# Patient Record
Sex: Female | Born: 1975 | Race: White | Hispanic: No | Marital: Married | Smoking: Current every day smoker
Health system: Southern US, Community
[De-identification: ages and names within clinical notes are randomized; demographics above are authoritative.]

## PROBLEM LIST (undated history)

## (undated) DIAGNOSIS — R519 Headache, unspecified: Secondary | ICD-10-CM

## (undated) DIAGNOSIS — J45909 Unspecified asthma, uncomplicated: Secondary | ICD-10-CM

## (undated) DIAGNOSIS — F419 Anxiety disorder, unspecified: Secondary | ICD-10-CM

## (undated) DIAGNOSIS — F329 Major depressive disorder, single episode, unspecified: Secondary | ICD-10-CM

## (undated) DIAGNOSIS — M1990 Unspecified osteoarthritis, unspecified site: Secondary | ICD-10-CM

## (undated) DIAGNOSIS — M797 Fibromyalgia: Secondary | ICD-10-CM

## (undated) DIAGNOSIS — F32A Depression, unspecified: Secondary | ICD-10-CM

## (undated) DIAGNOSIS — F410 Panic disorder [episodic paroxysmal anxiety] without agoraphobia: Secondary | ICD-10-CM

## (undated) DIAGNOSIS — I10 Essential (primary) hypertension: Secondary | ICD-10-CM

## (undated) DIAGNOSIS — M419 Scoliosis, unspecified: Secondary | ICD-10-CM

## (undated) DIAGNOSIS — M719 Bursopathy, unspecified: Secondary | ICD-10-CM

## (undated) DIAGNOSIS — R51 Headache: Secondary | ICD-10-CM

## (undated) HISTORY — DX: Headache, unspecified: R51.9

## (undated) HISTORY — DX: Bursopathy, unspecified: M71.9

## (undated) HISTORY — DX: Fibromyalgia: M79.7

## (undated) HISTORY — PX: MULTIPLE TOOTH EXTRACTIONS: SHX2053

## (undated) HISTORY — DX: Headache: R51

## (undated) HISTORY — DX: Scoliosis, unspecified: M41.9

## (undated) HISTORY — DX: Anxiety disorder, unspecified: F41.9

## (undated) HISTORY — DX: Unspecified osteoarthritis, unspecified site: M19.90

## (undated) HISTORY — PX: OTHER SURGICAL HISTORY: SHX169

## (undated) HISTORY — DX: Depression, unspecified: F32.A

## (undated) HISTORY — DX: Major depressive disorder, single episode, unspecified: F32.9

---

## 2002-07-07 ENCOUNTER — Inpatient Hospital Stay (HOSPITAL_COMMUNITY): Admission: AD | Admit: 2002-07-07 | Discharge: 2002-07-07 | Payer: Self-pay | Admitting: Obstetrics and Gynecology

## 2002-07-16 ENCOUNTER — Other Ambulatory Visit: Admission: RE | Admit: 2002-07-16 | Discharge: 2002-07-16 | Payer: Self-pay | Admitting: *Deleted

## 2003-02-21 ENCOUNTER — Inpatient Hospital Stay (HOSPITAL_COMMUNITY): Admission: AD | Admit: 2003-02-21 | Discharge: 2003-02-24 | Payer: Self-pay | Admitting: *Deleted

## 2003-03-31 ENCOUNTER — Other Ambulatory Visit: Admission: RE | Admit: 2003-03-31 | Discharge: 2003-03-31 | Payer: Self-pay | Admitting: *Deleted

## 2007-02-20 ENCOUNTER — Emergency Department (HOSPITAL_COMMUNITY): Admission: EM | Admit: 2007-02-20 | Discharge: 2007-02-20 | Payer: Self-pay | Admitting: Emergency Medicine

## 2008-02-12 ENCOUNTER — Inpatient Hospital Stay (HOSPITAL_COMMUNITY): Admission: RE | Admit: 2008-02-12 | Discharge: 2008-02-15 | Payer: Self-pay | Admitting: Obstetrics and Gynecology

## 2009-09-14 ENCOUNTER — Inpatient Hospital Stay (HOSPITAL_COMMUNITY): Admission: RE | Admit: 2009-09-14 | Discharge: 2009-09-16 | Payer: Self-pay | Admitting: Obstetrics and Gynecology

## 2010-07-04 LAB — TYPE AND SCREEN: Antibody Screen: NEGATIVE

## 2010-07-04 LAB — CBC
HCT: 29.9 % — ABNORMAL LOW (ref 36.0–46.0)
HCT: 34.8 % — ABNORMAL LOW (ref 36.0–46.0)
Hemoglobin: 9.3 g/dL — ABNORMAL LOW (ref 12.0–15.0)
Hemoglobin: 10.5 g/dL — ABNORMAL LOW (ref 12.0–15.0)
Hemoglobin: 12.1 g/dL (ref 12.0–15.0)
MCHC: 35.1 g/dL (ref 30.0–36.0)
MCV: 85.3 fL (ref 78.0–100.0)
Platelets: 183 10*3/uL (ref 150–400)
RBC: 3.08 MIL/uL — ABNORMAL LOW (ref 3.87–5.11)
RBC: 3.53 MIL/uL — ABNORMAL LOW (ref 3.87–5.11)
RBC: 4.08 MIL/uL (ref 3.87–5.11)
RDW: 12.6 % (ref 11.5–15.5)
WBC: 12.9 10*3/uL — ABNORMAL HIGH (ref 4.0–10.5)
WBC: 17.8 10*3/uL — ABNORMAL HIGH (ref 4.0–10.5)
WBC: 8.3 10*3/uL (ref 4.0–10.5)

## 2010-07-04 LAB — CCBB MATERNAL DONOR DRAW

## 2010-08-30 NOTE — Op Note (Signed)
NAMEALEXANDERIA, Melissa Walter                ACCOUNT NO.:  [id number]   MEDICAL RECORD NO.:  [id number]          PATIENT TYPE:  INP   LOCATION:  9121                          FACILITY:  [facility]   PHYSICIAN:  Duke Salvia. Marcelle Overlie, M.D.DATE OF BIRTH:  07/28/1975   DATE OF PROCEDURE:  02/12/2008  DATE OF DISCHARGE:                               OPERATIVE REPORT   PREOPERATIVE DIAGNOSIS:  Repeat cesarean section at term.   POSTOPERATIVE DIAGNOSIS:  Repeat cesarean section at term.   PROCEDURE:  Repeat low-transverse cesarean section.   SURGEON:  Duke Salvia. Marcelle Overlie, MD   ANESTHESIA:  Spinal.   COMPLICATIONS:  None.   DRAINS:  Foley catheter.   BLOOD LOSS:  700.   PROCEDURE AND FINDINGS:  The patient was taken to the operating room.  After an adequate level of spinal anesthetic was obtained with the  patient supine, the abdomen prepped and draped in usual manner for  sterile abdominal procedures.  Foley catheter was positioned draining  clear urine.  Transverse incision was made through the old scar which  was well healed, carried down to the fascia which was incised, and  extended transversely.  Rectus muscle was divided in the midline.  Peritoneum entered superiorly without incident and extended vertically.  There were some omental adhesions adherent to the anterior abdominal  wall which were lysed in an avascular plane.  Bladder blade was  positioned.  The vesicouterine serosa was incised and the bladder  bluntly and sharply dissected below, bladder blade repositioned.  Transverse incision made the lower segment extended with blunt  dissection.  Clear fluid noted.  The patient delivered of a healthy  female, Apgars 8 and 9 with the assistance of the BE and fundal pressure.  Loose nuchal cord x1.  Infant suctioned, cord clamped, and passed to  pediatric team for further care.  Placenta delivered manually intact,  uterus exteriorized, cavity wiped clean with laparotomy pack.  Closure  obtained  with first layer of 0 chromic followed by an imbricating layer  of 0 chromic.  The tubes and ovaries were normal.  The bladder flap area  was intact and hemostatic.  Clear urine noted at that point.  Prior to  closure, sponge, needle, and instruments counts were reported as correct  x2.  Peritoneum was closed with running 2-0 Vicryl suture.  2-0 Vicryl  interrupted sutures were used to reapproximate the rectus muscles, 0 PDS  used from laterally to midline on  either side to close the fascia.  Subcutaneous tissue was hemostatic.  Clips and Steri-Strips were used on the skin.  Again, clear urine noted  at the end of the case.  She did receive preop antibiotics and also  Pitocin IV after the cord was clamped.  Mother and baby were doing well  at that point.      Richard M. Marcelle Overlie, M.D.  Electronically Signed     RMH/MEDQ  D:  02/12/2008  T:  02/13/2008  Job:  [id number]

## 2010-08-30 NOTE — Discharge Summary (Signed)
Melissa, Walter                ACCOUNT NO.:  [id number]   MEDICAL RECORD NO.:  [id number]          PATIENT TYPE:  INP   LOCATION:  9121                          [facility]ILITY:  [facility]   PHYSICIAN:  Guy Sandifer. Henderson Cloud, M.D. DATE OF BIRTH:  27-Jan-1976   DATE OF ADMISSION:  02/12/2008  DATE OF DISCHARGE:  02/15/2008                               DISCHARGE SUMMARY   ADMITTING DIAGNOSIS:  Previous cesarean section, desires repeat.   DISCHARGE DIAGNOSIS:  Previous cesarean section, desires repeat.   PROCEDURE:  On February 12, 2008 is repeat low-transverse cesarean  section.   REASON FOR ADMISSION:  The patient is a 35 year old married white female  G2, P1 with an EDC of February 14, 2008 who had previous cesarean  section.  After discussion, she desires repeat.  She is admitted for  this.   HOSPITAL COURSE:  The patient has been in the hospital and undergoes the  above procedure.  It is productive of a viable female infant, Apgars of 8  and 9 at 1 and 5 minutes respectively.  After surgery, she has a good  resumption of bowel activity and bladder activity.  She is tolerating  regular diet, has vital signs that are stable, and she is afebrile.  Hemoglobin on the first postoperative day is 8.5.   CONDITION ON DISCHARGE:  Good.   DIET:  Regular as tolerated.   ACTIVITY:  No lifting, operation of automobiles, no vaginal entry.  She  is to call the office for problems including but not limited to  temperature of 101 degrees, persistent nausea or vomiting, heavy vaginal  bleeding.  Followup is in the office in 1-2 weeks.   MEDICATIONS:  Percocet 5/325 mg #40 one to two p.o. q.6 h. p.r.n.;  ibuprofen 600 mg q.6 h. p.r.n.; Integra iron supplement #30 one p.o.  daily, one refill.      Guy Sandifer Henderson Cloud, M.D.  Electronically Signed     JET/MEDQ  D:  02/15/2008  T:  02/15/2008  Job:  [id number]

## 2010-08-30 NOTE — H&P (Signed)
NAMECHARLEE, Melissa Walter                ACCOUNT NO.:  [id number]   MEDICAL RECORD NO.:  [id number]          PATIENT TYPE:   LOCATION:                                [facility]   PHYSICIAN:  Duke Salvia. Marcelle Overlie, M.D.    DATE OF BIRTH:   DATE OF ADMISSION:  02/12/2008  DATE OF DISCHARGE:                              HISTORY & PHYSICAL   Date of scheduled surgery on February 12, 2008.   CHIEF COMPLAINT:  Repeat cesarean section at term.   HISTORY OF PRESENT ILLNESS:  A 35 year old G2, P63, EDD on October 2009,  as presents for repeat cesarean section, declines VBAC.  First section  done for CPD.  She has had an uneventful pregnancy, taking Protonix for  GERD.  The first trimester screening tests were normal.  She is Rh  negative, did receive RhoGAM, GBS was negative, 1-hour GTT was 101.  This procedure including risks related to bleeding, infection, adjacent  organ injury, transfusion, all reviewed with her which she understands  and accepts.   PAST MEDICAL HISTORY:   ALLERGIES:  LATEX allergy possibly MORPHINE.   OPERATIONS:  Cesarean section.   REMAINDER OF HER PAST MEDICAL HISTORY:  Please see the [facility] form.   PHYSICAL EXAMINATION:  VITAL SIGNS:  Temperature 98.2, blood pressure  120/78.  HEENT:  Unremarkable.  NECK:  Supple without masses.  LUNGS:  Clear.  CARDIOVASCULAR:  Regular rate and rhythm without murmurs, rubs, or  gallops.  BREASTS:  Not examined.  Term, fundal height.  Fetal heart rate 140.  Cervix is closed.  EXTREMITIES:  Unremarkable.  NEUROLOGIC:  Unremarkable.   IMPRESSION:  Term intrauterine pregnancy, previous cesarean section for  repeat cesarean section.  Procedure and risks reviewed as above up.      Richard M. Marcelle Overlie, M.D.  Electronically Signed     RMH/MEDQ  D:  02/05/2008  T:  02/06/2008  Job:  [id number]

## 2010-09-02 NOTE — Op Note (Signed)
NAME:  Melissa Walter, Melissa Walter                          ACCOUNT NO.:  [id number]   MEDICAL RECORD NO.:  [id number]                   PATIENT TYPE:  INP   LOCATION:  9142                                 [facility]ILITY[facility]   PHYSICIAN:  Gerri Spore B. Earlene Plater, M.D.               DATE OF BIRTH:  1975/12/03   DATE OF PROCEDURE:  DATE OF DISCHARGE:                                 OPERATIVE REPORT   PREOPERATIVE DIAGNOSES:  1. Term intrauterine pregnancy with spontaneous labor.  2. Failure to progress beyond 8 cm.   POSTOPERATIVE DIAGNOSES:  1. Term intrauterine pregnancy with spontaneous labor.  2. Failure to progress beyond 8 cm.   PROCEDURE:  Primary low transverse cesarean section.   SURGEON:  Chester Holstein. Earlene Plater, M.D.   ANESTHESIA:  Epidural.   FINDINGS:  Viable female infant, thin meconium, weight 8 pounds 8 ounces, O-P  position, Apgars 8 and 9.  Normal uterus, tubes, and ovaries.   BLOOD LOSS:  500 mL.   COMPLICATIONS:  None.   INDICATIONS:  The patient presented in spontaneous labor and at  approximately 8 cm around 6 a.m. on the day of admission.  Patient did not  progress beyond 8 cm despite Pitocin augmentation with documentation of  adequate labor by IUPC.  Patient remained unchanged for approximately 8  hours in total.  Given the lack of progress, I recommended we proceed with  cesarean section.   PROCEDURE:  The patient was taken to the operating room with an epidural  anesthesia in place.  She was prepped and draped in the standard fashion.  A  Foley catheter was already in the bladder.   A Pfannenstiel incision made with a knife and carried sharply to the fascia.  The fascia was divided sharply.  The superior edge of the fascial incision  was elevated with Kocher clamps and the inferior and the underlying rectus  muscles were dissected off sharply.  Repeated in the midline in a similar  fashion.   The midline was identified and posterior sheath and peritoneum entered   sharply.   Bladder blade inserted, bladder flap created with sharp and blunt technique.  The uterine incision was made in a low transverse fashion with the knife.  Extended laterally with bandage scissors.  Lightly-tinted meconium fluid  noted at entry to the uterine cavity.   The infant's head was delivered through the incision without difficulty.  Assistance with the Kiwi vacuum was necessary to deliver the head.  This was  placed on the flexion point in the midline just anterior to the posterior  fontanelle.  __________ traction necessary to deliver the head, no pop-offs  encountered from the mid-brain zone.   The anterior shoulder was delivered first to facilitate the remainder of  delivery.  The cord was clamped and cut, and the infant handed off to the  waiting pediatricians.  The DeLee suction was used just after  delivery of  the head, given the light meconium.   The placenta was removed manually, and the uterus cleared of all clots and  debris.  The uterine incision was inspected and was free of extension.   The uterine incision was closed in a running lock stitch of 0 Monocryl.  A  second imbricating layer placed with the same suture.  Bleeder noted at the  right side of the incision about 2 cm medial to the lateral-most aspect.  This was made hemostatic with a figure-of-eight suture of 0 chromic.   The pelvis was irrigated, and the uterine incision and bladder flap were  hemostatic.  The subfascial space was hemostatic.  The fascia was closed  with a running stitch of 0 Vicryl.  The subcutaneous tissue was irrigated  and made hemostatic with a Bovie.  The skin was closed with staples.   The patient tolerated the procedure well, and there were no complications.  She was taken to the recovery room in awake, alert, and stable condition.  All counts were correct, per the operating room staff.                                               Gerri Spore B. Earlene Plater, M.D.    WBD/MEDQ   D:  02/21/2003  T:  02/22/2003  Job:  [id number]

## 2010-09-02 NOTE — Discharge Summary (Signed)
NAME:  Melissa Walter, Melissa Walter                          ACCOUNT NO.:  [id number]   MEDICAL RECORD NO.:  [id number]                   PATIENT TYPE:  INP   LOCATION:  9142                                 [facility]   PHYSICIAN:  Gerri Spore B. Earlene Plater, M.D.               DATE OF BIRTH:  1975-07-27   DATE OF ADMISSION:  02/21/2003  DATE OF DISCHARGE:  02/24/2003                                 DISCHARGE SUMMARY   ADMISSION DIAGNOSES:  1. Term intrauterine pregnancy.  2. Active labor.  3. Failure to progress beyond 8 cm.   DISCHARGE DIAGNOSES:  1. Term intrauterine pregnancy.  2. Active labor.  3. Failure to progress beyond 8 cm.   PROCEDURES:  1. Admission for management of labor.  2. Primary low transverse cesarean section for failure to progress beyond 8     cm.   HISTORY OF PRESENT ILLNESS:  This is a 35 year old, white female, G1, P0,  39+ weeks who presented in spontaneous labor.  She was 8 cm on admission.   HOSPITAL COURSE:  The patient was admitted to labor and delivery and would  not progress beyond 8 cm despite Pitocin augmentation and documentation of  adequate labor by IUCP.  Given lack of progress, we proceeded with cesarean  section.  Findings at the time of surgery included a viable female infant with  thin meconium, weight 8 pounds 8 ounces in the OP position, Apgar's 8 and 9  with normal uterus, tubes and ovaries.  Postoperatively, the patient rapidly  regained ability to ambulate, void and tolerate a regular diet.  She was  discharged to home on postop day #3 in satisfactory condition.   DISCHARGE MEDICATIONS:  Tylox one to two p.o. q.4-6h. p.r.n. pain.   SPECIAL INSTRUCTIONS:  Standard preprinted instructions were given prior to  dismissal.   FOLLOW UP:  Follow up at [facility] in a month.   DISPOSITION:  Satisfactory.                                               Gerri Spore B. Earlene Plater, M.D.    WBD/MEDQ  D:  03/17/2003  T:  03/17/2003  Job:  [id number]

## 2010-09-02 NOTE — H&P (Signed)
   NAME:  Melissa Walter, Melissa Walter                          ACCOUNT NO.:  [id number]   MEDICAL RECORD NO.:  [id number]                   PATIENT TYPE:  INP   LOCATION:  9168                                 [facility]   PHYSICIAN:  Gerri Spore B. Earlene Plater, M.D.               DATE OF BIRTH:  [birth year]   DATE OF ADMISSION:  02/21/2003  DATE OF DISCHARGE:                                HISTORY & PHYSICAL   ADMISSION DIAGNOSES:  1. A 39 week intrauterine pregnancy.  2. Spontaneous labor.   HISTORY OF PRESENT ILLNESS:  A 35 year old white female gravida 1, para 0 at  39+ weeks present in spontaneous labor. Was found to be 8 cm in Maternity  Admissions Unit.   PRENATAL CARE:  [facility], Dr. Earlene Plater,  uncomplicated.   LABORATORY DATA:  Rh negative and received RhoGAM. Group B strep negative.  O negative. Rubella immune. Glucola  normal.   PAST MEDICAL HISTORY:  None.   FAMILY HISTORY:  Noncontributory.   PAST SURGICAL HISTORY:  See prenatal records.   REVIEW OF SYSTEMS:  Otherwise noncontributory.   PHYSICAL EXAMINATION:  VITAL SIGNS:  The patient noted to be afebrile with  stable vital signs. Fetal heart rate tracing is reactive with contractions  every 5 minutes.  GENERAL:  Alert and oriented. No acute distress.  SKIN:  Warm and dry with no lesions.  HEART:  Regular rate and rhythm.  LUNGS:  Clear to auscultation.  ABDOMEN:  Gravid. Fundal height 41 cm. Estimated fetal weight in the office  yesterday was 8 pounds 11 ounces by ultrasound.  PELVIC:  Cervix is 8 cm dilated, completely effaced, zero station, vertex.  Membranes are ruptured and there is light meconium staining which is clear  (no particulate matter). IUPC inserted.   ASSESSMENT:  Term intrauterine pregnancy, spontaneous labor. No progress in  the last 2 hours. Will monitor [place] unit and if inadequate, augment  with Pitocin.                                               Gerri Spore B. Earlene Plater,  M.D.    WBD/MEDQ  D:  02/21/2003  T:  02/21/2003  Job:  [id number]

## 2011-01-16 LAB — CBC
HCT: 25.1 — ABNORMAL LOW
HCT: 38.8
Hemoglobin: 13.1
MCHC: 33.7
MCV: 85.4
MCV: 85.5
Platelets: 161
RBC: 2.94 — ABNORMAL LOW
RBC: 4.54
RDW: 13.4
WBC: 9.7

## 2011-01-16 LAB — CCBB MATERNAL DONOR DRAW

## 2011-01-16 LAB — TYPE AND SCREEN: ABO/RH(D): O NEG

## 2011-01-16 LAB — ABO/RH: ABO/RH(D): O NEG

## 2014-01-29 DIAGNOSIS — Z720 Tobacco use: Secondary | ICD-10-CM | POA: Insufficient documentation

## 2015-01-23 ENCOUNTER — Emergency Department (INDEPENDENT_AMBULATORY_CARE_PROVIDER_SITE_OTHER)
Admission: EM | Admit: 2015-01-23 | Discharge: 2015-01-23 | Disposition: A | Discharge status: Home / Self Care | Payer: 59 | Source: Home / Self Care | Attending: Family Medicine | Admitting: Family Medicine

## 2015-01-23 ENCOUNTER — Encounter (HOSPITAL_COMMUNITY): Payer: Self-pay | Admitting: Emergency Medicine

## 2015-01-23 DIAGNOSIS — F410 Panic disorder [episodic paroxysmal anxiety] without agoraphobia: Secondary | ICD-10-CM

## 2015-01-23 DIAGNOSIS — F430 Acute stress reaction: Principal | ICD-10-CM

## 2015-01-23 HISTORY — DX: Panic disorder (episodic paroxysmal anxiety): F41.0

## 2015-01-23 LAB — POCT I-STAT, CHEM 8
BUN: 9 mg/dL (ref 6–20)
CALCIUM ION: 1.17 mmol/L (ref 1.12–1.23)
CHLORIDE: 105 mmol/L (ref 101–111)
CREATININE: 0.6 mg/dL (ref 0.44–1.00)
GLUCOSE: 95 mg/dL (ref 65–99)
HCT: 42 % (ref 36.0–46.0)
Hemoglobin: 14.3 g/dL (ref 12.0–15.0)
Potassium: 3.9 mmol/L (ref 3.5–5.1)
Sodium: 140 mmol/L (ref 135–145)
TCO2: 24 mmol/L (ref 0–100)

## 2015-01-23 MED ORDER — CLONAZEPAM 0.5 MG PO TABS: 0.5000 mg | ORAL_TABLET | Freq: Two times a day (BID) | ORAL | Status: DC | PRN

## 2015-01-23 NOTE — ED Notes (Signed)
Patient reports around 5:00 pm this afternoon started noticing multiple complaints.  Patient was at work.  Denies getting upset prior to feeling so bad.  History of anxiety.  Patient now has headache, vision changes, chest pain, quivering all over, breathing fast, feeling as if she cannot breathe.

## 2015-01-23 NOTE — ED Provider Notes (Signed)
CSN: [id number]     Arrival date & time 01/23/15  1845 History   First MD Initiated Contact with Patient 01/23/15 1900     Chief Complaint  Patient presents with  . Headache  . Shortness of Breath   (Consider location/radiation/quality/duration/timing/severity/associated sxs/prior Treatment) Patient is a 39 y.o. female presenting with anxiety. The history is provided by the patient.  Anxiety This is a new problem. The current episode started 1 to 2 hours ago. The problem has not changed since onset.Associated symptoms include chest pain, headaches and shortness of breath. Associated symptoms comments: Onset at 5pm at work at [employer].. The symptoms are aggravated by stress.    Past Medical History  Diagnosis Date  . Panic attacks    History reviewed. No pertinent past surgical history. No family history on file. Social History  Substance Use Topics  . Smoking status: None  . Smokeless tobacco: None  . Alcohol Use: None   OB History    No data available     Review of Systems  Constitutional: Negative.   Eyes: Positive for visual disturbance.  Respiratory: Positive for shortness of breath. Negative for wheezing.   Cardiovascular: Positive for chest pain. Negative for palpitations and leg swelling.  Gastrointestinal: Negative.   Neurological: Positive for dizziness and headaches.  All other systems reviewed and are negative.   Allergies  Review of patient's allergies indicates no known allergies.  Home Medications   Prior to Admission medications   Medication Sig Start Date End Date Taking? Authorizing Provider  Zolpidem Tartrate (AMBIEN PO) Take by mouth.   Yes Historical Provider, MD  clonazePAM (KLONOPIN) 0.5 MG tablet Take 1 tablet (0.5 mg total) by mouth 2 (two) times daily as needed for anxiety. 01/23/15   Linna Hoff, MD   Meds Ordered and Administered this Visit  Medications - No data to display  BP 135/85 mmHg  Pulse 126  Temp(Src) 99.3 F (37.4 C) (Oral)   Resp 28  SpO2 100% No data found.   Physical Exam  Constitutional: She is oriented to person, place, and time. She appears well-developed and well-nourished.  Eyes: Conjunctivae and EOM are normal. Pupils are equal, round, and reactive to light.  Neck: Normal range of motion. Neck supple.  Cardiovascular: Regular rhythm, normal heart sounds, intact distal pulses and normal pulses.  Tachycardia present.  PMI is not displaced.   No murmur heard. Pulmonary/Chest: Effort normal and breath sounds normal.  Lymphadenopathy:    She has no cervical adenopathy.  Neurological: She is alert and oriented to person, place, and time.  Skin: Skin is warm and dry.  Nursing note and vitals reviewed.   ED Course  Procedures (including critical care time)  Labs Review Labs Reviewed  POCT I-STAT, CHEM 8    i-stat 8 wnl.  ED ECG REPORT   Date: 01/23/2015  Rate: 101  Rhythm: sinus tachycardia  QRS Axis: normal  Intervals: normal  ST/T Wave abnormalities: normal  Conduction Disutrbances:none  Narrative Interpretation:   Old EKG Reviewed: none available  I have personally reviewed the EKG tracing and agree with the computerized printout as noted.  Imaging Review No results found.   Visual Acuity Review  Right Eye Distance:   Left Eye Distance:   Bilateral Distance:    Right Eye Near:   Left Eye Near:    Bilateral Near:         MDM   1. Panic attack as reaction to stress  Benjamine Strout D KLinna Hoff0/08/16 [phone]

## 2015-01-27 ENCOUNTER — Other Ambulatory Visit: Payer: Self-pay | Admitting: Nurse Practitioner

## 2015-01-27 ENCOUNTER — Ambulatory Visit
Admission: RE | Admit: 2015-01-27 | Discharge: 2015-01-27 | Disposition: A | Discharge status: Home / Self Care | Payer: 59 | Source: Ambulatory Visit | Attending: Nurse Practitioner | Admitting: Nurse Practitioner

## 2015-01-27 DIAGNOSIS — Z139 Encounter for screening, unspecified: Secondary | ICD-10-CM

## 2015-01-29 ENCOUNTER — Encounter: Payer: Self-pay | Admitting: Diagnostic Neuroimaging

## 2015-01-29 ENCOUNTER — Ambulatory Visit (INDEPENDENT_AMBULATORY_CARE_PROVIDER_SITE_OTHER): Payer: 59 | Admitting: Diagnostic Neuroimaging

## 2015-01-29 VITALS — BP 114/80 | HR 96 | Ht 62.75 in | Wt 164.6 lb

## 2015-01-29 DIAGNOSIS — G43109 Migraine with aura, not intractable, without status migrainosus: Secondary | ICD-10-CM

## 2015-01-29 DIAGNOSIS — H539 Unspecified visual disturbance: Secondary | ICD-10-CM | POA: Diagnosis not present

## 2015-01-29 MED ORDER — TOPIRAMATE 50 MG PO TABS: 50.0000 mg | ORAL_TABLET | Freq: Two times a day (BID) | ORAL | Status: DC

## 2015-01-29 NOTE — Patient Instructions (Signed)
Thank you for coming to see [place] at [facility]. I hope we have been able to provide you high quality care today.  You may receive a patient satisfaction survey over the next few weeks. We would appreciate your feedback and comments so that we may continue to improve ourselves and the health of our patients.  - start topiramate 77m at bedtime; after 1-2 weeks increase to twice a day - continue maxalt as needed for breakthrough migraines; can take with aleve, ibuprofen or tylenol - monitor headaches with headache journal/diary   ~~~~~~~~~~~~~~~~~~~~~~~~~~~~~~~~~~~~~~~~~~~~~~~~~~~~~~~~~~~~~~~~~  DR. Keirstan Iannello'S GUIDE TO HAPPY AND HEALTHY LIVING These are some of my general health and wellness recommendations. Some of them may apply to you better than others. Please use common sense as you try these suggestions and feel free to ask me any questions.   ACTIVITY/FITNESS Mental, social, emotional and physical stimulation are very important for brain and body health. Try learning a new activity (arts, music, language, sports, games).  Keep moving your body to the best of your abilities. You can do this at home, inside or outside, the park, community center, gym or anywhere you like. Consider a physical therapist or personal trainer to get started. Consider the app Sworkit. Fitness trackers such as smart-watches, smart-phones or Fitbits can help as well.   NUTRITION Eat more plants: colorful vegetables, nuts, seeds and berries.  Eat less sugar, salt, preservatives and processed foods.  Avoid toxins such as cigarettes and alcohol.  Drink water when you are thirsty. Warm water with a slice of lemon is an excellent morning drink to start the day.  Consider these websites for more information The Nutrition Source (hhttps://www.henry-hernandez.biz/ [employer] Nutrition (wWindowBlog.ch   RELAXATION Consider practicing mindfulness  meditation or other relaxation techniques such as deep breathing, prayer, yoga, tai chi, massage. See website mindful.org or the apps Headspace or Calm to help get started.   SLEEP Try to get at least 7-8+ hours sleep per day. Regular exercise and reduced caffeine will help you sleep better. Practice good sleep hygeine techniques. See website sleep.org for more information.   PLANNING Prepare estate planning, living will, healthcare POA documents. Sometimes this is best planned with the help of an attorney. Theconversationproject.org and agingwithdignity.org are excellent resources.

## 2015-01-29 NOTE — Progress Notes (Signed)
[facility]  PATIENT: Melissa Walter DOB: 1976/01/24  REFERRING CLINICIAN: Conley Rolls  HISTORY FROM: patient  REASON FOR VISIT: new consult    HISTORICAL  CHIEF COMPLAINT:  Chief Complaint  Patient presents with  . Headache    rm 6, New Patient, "floaters, tunnel vision"    HISTORY OF PRESENT ILLNESS:   39 year old right-handed female here for evaluation of headaches and visual changes. History of depression, anxiety, fibromyalgia, arthritis, bursitis, scoliosis.  In her 49s, patient was on birth control pill, had onset of unilateral or bilateral throbbing severe headaches, nausea, photophobia, lasting hours at a time. Sometimes she would see colored sparkling lights before onset of headaches. Patient went off of birth control pill and headaches had resolved.  Over the past 5 years patient has had recurrence of right-sided aching headaches with nausea, photophobia, now averaging 3 times per week for more than one year.  Last week patient had onset, and recurrence of visual aura colored lights, Christmas tree lights, followed by severe pressure and throbbing headache with photophobia and phonophobia. Headaches lasted for more than 2 hours. Patient had chest pain, shortness of breath and panic attack symptoms, went to the emergency room for evaluation.  Patient has missed several days of work this week and last week. Headaches are getting worse.  Patient has family history of migraine headaches in maternal uncle and maternal cousin. Patient has been using Maxalt as needed for headache, but thinks she took it too late after onset of headache. Patient has never been on migraine preventive medicines.    REVIEW OF SYSTEMS: Full 14 system review of systems performed and notable only for insomnia confusion headache numbness weakness dizziness tremor restless legs depression anxiety joint pain joint swelling cramps aching muscles flushing feeling hot easy bruising blurred  vision double vision loss vision eye pain shortness of breath fatigue chest pain palpitations ringing in ears spinning sensation incontinence.  ALLERGIES: Allergies  Allergen Reactions  . Amoxicillin Hives, Itching and Nausea And Vomiting  . Latex Swelling    blisters    HOME MEDICATIONS: Outpatient Prescriptions Prior to Visit  Medication Sig Dispense Refill  . clonazePAM (KLONOPIN) 0.5 MG tablet Take 1 tablet (0.5 mg total) by mouth 2 (two) times daily as needed for anxiety. 20 tablet 0  . Zolpidem Tartrate (AMBIEN PO) Take by mouth.     No facility-administered medications prior to visit.    PAST MEDICAL HISTORY: Past Medical History  Diagnosis Date  . Panic attacks   . Arthritis   . Bursitis   . Scoliosis   . Depression   . Anxiety   . Fibromyalgia   . Frequent headaches     PAST SURGICAL HISTORY: Past Surgical History  Procedure Laterality Date  . Cesarean section      x 3  . Tubal ligation    . Multiple tooth extractions      6 teeth    FAMILY HISTORY: Family History  Problem Relation Age of Onset  . Fibromyalgia Mother   . Arthritis Mother   . Cancer - Colon Maternal Aunt   . Stroke Paternal Aunt   . Cancer Paternal Aunt     breast  . Arthritis Maternal Grandmother   . Diabetes Maternal Grandmother   . Alzheimer's disease Maternal Grandmother   . Fibromyalgia Paternal Grandmother   . Arthritis Paternal Grandmother     SOCIAL HISTORY:  Social History   Social History  . Marital Status: Married    Spouse Name:  Chrissie NoaWilliam  . Number of Children: 3  . Years of Education: 13.5   Occupational History  .      [employer]   Social History Main Topics  . Smoking status: Current Every Day Smoker -- 1.00 packs/day for 21 years  . Smokeless tobacco: Not on file  . Alcohol Use: Yes     Comment: rare  . Drug Use: No  . Sexual Activity: Not on file   Other Topics Concern  . Not on file   Social History Narrative   Lives at home with husband, 3  children    caffeine use- coffee, 2 cups daily     PHYSICAL EXAM  GENERAL EXAM/CONSTITUTIONAL: Vitals:  Filed Vitals:   01/29/15 0954  BP: 114/80  Pulse: 96  Height: 5' 2.75" (1.594 m)  Weight: 164 lb 9.6 oz (74.662 kg)     Body mass index is 29.38 kg/(m^2).  Visual Acuity Screening   Right eye Left eye Both eyes  Without correction:     With correction: 20/20 20/20      Patient is in no distress; well developed, nourished and groomed; neck is supple  CARDIOVASCULAR:  Examination of carotid arteries is normal; no carotid bruits  Regular rate and rhythm, no murmurs  Examination of peripheral vascular system by observation and palpation is normal  EYES:  Ophthalmoscopic exam of optic discs and posterior segments is normal; no papilledema or hemorrhages  MUSCULOSKELETAL:  Gait, strength, tone, movements noted in Neurologic exam below  NEUROLOGIC: MENTAL STATUS:  No flowsheet data found.  awake, alert, oriented to person, place and time  recent and remote memory intact  normal attention and concentration  language fluent, comprehension intact, naming intact,   fund of knowledge appropriate  CRANIAL NERVE:   2nd - no papilledema on fundoscopic exam  2nd, 3rd, 4th, 6th - pupils equal and reactive to light, visual fields full to confrontation, extraocular muscles intact, no nystagmus  5th - facial sensation symmetric  7th - facial strength symmetric  8th - hearing intact  9th - palate elevates symmetrically, uvula midline  11th - shoulder shrug symmetric  12th - tongue protrusion midline  MOTOR:   normal bulk and tone, full strength in the BUE, BLE  SENSORY:   normal and symmetric to light touch, pinprick, temperature, vibration  COORDINATION:   finger-nose-finger, fine finger movements normal  REFLEXES:   deep tendon reflexes present and symmetric  GAIT/STATION:   narrow based gait; able to walk tandem; romberg is  negative    DIAGNOSTIC DATA (LABS, IMAGING, TESTING) - I reviewed patient records, labs, notes, testing and imaging myself where available.  Lab Results  Component Value Date   WBC 12.9* 09/15/2009   HGB 14.3 01/23/2015   HCT 42.0 01/23/2015   MCV 85.5 09/15/2009   PLT 163 09/15/2009      Component Value Date/Time   NA 140 01/23/2015 1928   K 3.9 01/23/2015 1928   CL 105 01/23/2015 1928   GLUCOSE 95 01/23/2015 1928   BUN 9 01/23/2015 1928   CREATININE 0.60 01/23/2015 1928   No results found for: CHOL, HDL, LDLCALC, LDLDIRECT, TRIG, CHOLHDL No results found for: [id number] No results found for: VITAMINB12 No results found for: TSH   01/27/15 Skull xray [I reviewed images myself and agree with interpretation. -VRP]  1. No metallic foreign bodies. Patient cleared for MRI. 2. Questionable lytic lesion left parietal occipital region. Further evaluation with enhanced MRI or CT suggested.    ASSESSMENT  AND PLAN  39 y.o. year old female here with history of migraine with aura since age 19 years old, with more than one year of episodic migraine 3 times per week, now with increasing severity and frequency of headaches. Most likely represents migraine phenomenon. We'll check MRI brain to rule out secondary causes. Will start preventative therapy with topiramate. Continue rizatriptan as needed for breakthrough headaches. May also use naproxen, ibuprofen and acetaminophen in combination with triptan.   Dx:  Migraine with aura and without status migrainosus, not intractable  (Episodic migraine with aura)    PLAN: - start topiramate  at bedtime; after 1-2 weeks increase to twice a day - continue maxalt as needed for breakthrough migraines; can take with aleve, ibuprofen or tylenol - monitor headaches with headache journal/diary - consider migraine observation trial/study  Orders Placed This Encounter  Procedures  . MR Brain Wo Contrast   Return in about 6 weeks (around  03/12/2015).    Suanne Marker, MD 01/29/2015, 11:23 AM Certified in Neurology, Neurophysiology and Neuroimaging  [facility] [address], [address] [place], [place] [address] [phone]

## 2015-02-03 ENCOUNTER — Encounter (INDEPENDENT_AMBULATORY_CARE_PROVIDER_SITE_OTHER): Payer: Self-pay

## 2015-02-03 DIAGNOSIS — Z0289 Encounter for other administrative examinations: Secondary | ICD-10-CM

## 2015-02-07 ENCOUNTER — Ambulatory Visit
Admission: RE | Admit: 2015-02-07 | Discharge: 2015-02-07 | Disposition: A | Discharge status: Home / Self Care | Payer: 59 | Source: Ambulatory Visit | Attending: Diagnostic Neuroimaging | Admitting: Diagnostic Neuroimaging

## 2015-02-07 DIAGNOSIS — G43109 Migraine with aura, not intractable, without status migrainosus: Secondary | ICD-10-CM

## 2015-02-07 DIAGNOSIS — H539 Unspecified visual disturbance: Secondary | ICD-10-CM | POA: Diagnosis not present

## 2015-02-09 ENCOUNTER — Telehealth: Payer: Self-pay | Admitting: *Deleted

## 2015-02-09 NOTE — Telephone Encounter (Signed)
Spoke with patient and informed her, per Dr Marjory LiesPenumalli, MRI results are normal. Reminded her of upcoming FU. She verbalized understanding, appreciation.

## 2015-03-16 ENCOUNTER — Encounter: Payer: Self-pay | Admitting: Diagnostic Neuroimaging

## 2015-03-16 ENCOUNTER — Ambulatory Visit (INDEPENDENT_AMBULATORY_CARE_PROVIDER_SITE_OTHER): Payer: 59 | Admitting: Diagnostic Neuroimaging

## 2015-03-16 VITALS — BP 122/86 | HR 88 | Resp 16 | Ht 62.75 in | Wt 159.4 lb

## 2015-03-16 DIAGNOSIS — G43109 Migraine with aura, not intractable, without status migrainosus: Secondary | ICD-10-CM | POA: Insufficient documentation

## 2015-03-16 DIAGNOSIS — F32A Depression, unspecified: Secondary | ICD-10-CM | POA: Insufficient documentation

## 2015-03-16 DIAGNOSIS — M1990 Unspecified osteoarthritis, unspecified site: Secondary | ICD-10-CM | POA: Insufficient documentation

## 2015-03-16 DIAGNOSIS — F419 Anxiety disorder, unspecified: Secondary | ICD-10-CM | POA: Insufficient documentation

## 2015-03-16 DIAGNOSIS — M797 Fibromyalgia: Secondary | ICD-10-CM | POA: Insufficient documentation

## 2015-03-16 DIAGNOSIS — F329 Major depressive disorder, single episode, unspecified: Secondary | ICD-10-CM | POA: Insufficient documentation

## 2015-03-16 NOTE — Progress Notes (Signed)
[facility]  PATIENT: Melissa Walter DOB: [birth year]  REFERRING CLINICIAN: Conley Rolls  HISTORY FROM: patient  REASON FOR VISIT: follow up    HISTORICAL  CHIEF COMPLAINT:  Chief Complaint  Patient presents with  . Migraines    Sts. h/a's are same frequency, same severity since last ov.  Sts. she was unable to tolerate full Topamax dose due to tingling/numbness in face, hands, felt eyes were jumping, diarrhea.  She is currently taking Topamax 50 mg qhs only.  Sts. Maxalt did not help h/a's, so she stopped it.  She has not kept a h/a journal, but sts. feels h/a's are related to her menstrual cycles/fim    HISTORY OF PRESENT ILLNESS:   UPDATE 03/16/15: Since last visit, has started on TPX  BID, then had to reduce to qhs due to numbness. Tried rizatriptan without benefit. MRI brain was normal. Now using tizanidine and naproxen (for fibromyalgia) and yesterday she has been feeling better.  PRIOR HPI (01/29/15): 39 year old right-handed female here for evaluation of headaches and visual changes. History of depression, anxiety, fibromyalgia, arthritis, bursitis, scoliosis. In her 40s, patient was on birth control pill, had onset of unilateral or bilateral throbbing severe headaches, nausea, photophobia, lasting hours at a time. Sometimes she would see colored sparkling lights before onset of headaches. Patient went off of birth control pill and headaches had resolved. Over the past 5 years patient has had recurrence of right-sided aching headaches with nausea, photophobia, no averaging 3 times per week for more than one year. Last week patient had onset, and recurrence of visual aura colored lights, Christmas tree lights, followed by severe pressure and throbbing headache with photophobia and phonophobia. Headaches lasted for more than 2 hours. Patient had chest pain, shortness of breath and panic attack symptoms, went to the emergency room for evaluation. Patient has missed several  days of work this week and last week. Headaches are getting worse. Patient has family history of migraine headaches in maternal uncle and maternal cousin. Patient has been using Maxalt as needed for headache, but thinks she took it too late after onset of headache. Patient has never been on migraine preventive medicines.    REVIEW OF SYSTEMS: Full 14 system review of systems performed and notable only for insomnia confusion headache numbness weakness dizziness tremor restless legs depression anxiety joint pain joint swelling cramps aching muscles flushing feeling hot easy bruising blurred vision double vision loss vision eye pain shortness of breath fatigue chest pain palpitations ringing in ears spinning sensation incontinence.  ALLERGIES: Allergies  Allergen Reactions  . Amoxicillin Hives, Itching and Nausea And Vomiting  . Latex Swelling    blisters    HOME MEDICATIONS: Outpatient Prescriptions Prior to Visit  Medication Sig Dispense Refill  . clonazePAM (KLONOPIN) 0.5 MG tablet Take 1 tablet (0.5 mg total) by mouth 2 (two) times daily as needed for anxiety. 20 tablet 0  . Multiple Vitamin (THERA) TABS Take 1 tablet by mouth.    . topiramate (TOPAMAX) 50 MG tablet Take 1 tablet (50 mg total) by mouth 2 (two) times daily. 60 tablet 12  . rizatriptan (MAXALT) 10 MG tablet     . Zolpidem Tartrate (AMBIEN PO) Take by mouth.     No facility-administered medications prior to visit.    PAST MEDICAL HISTORY: Past Medical History  Diagnosis Date  . Panic attacks   . Arthritis   . Bursitis   . Scoliosis   . Depression   . Anxiety   .  Fibromyalgia   . Frequent headaches     PAST SURGICAL HISTORY: Past Surgical History  Procedure Laterality Date  . Cesarean section      x 3  . Tubal ligation    . Multiple tooth extractions      6 teeth    FAMILY HISTORY: Family History  Problem Relation Age of Onset  . Fibromyalgia Mother   . Arthritis Mother   . Cancer - Colon Maternal  Aunt   . Stroke Paternal Aunt   . Cancer Paternal Aunt     breast  . Arthritis Maternal Grandmother   . Diabetes Maternal Grandmother   . Alzheimer's disease Maternal Grandmother   . Fibromyalgia Paternal Grandmother   . Arthritis Paternal Grandmother     SOCIAL HISTORY:  Social History   Social History  . Marital Status: Married    Spouse Name: Chrissie NoaWilliam  . Number of Children: 3  . Years of Education: 13.5   Occupational History  .      [employer]   Social History Main Topics  . Smoking status: Current Every Day Smoker -- 1.00 packs/day for 21 years  . Smokeless tobacco: Not on file  . Alcohol Use: Yes     Comment: rare  . Drug Use: No  . Sexual Activity: Not on file   Other Topics Concern  . Not on file   Social History Narrative   Lives at home with husband, 3 children    caffeine use- coffee, 2 cups daily     PHYSICAL EXAM  GENERAL EXAM/CONSTITUTIONAL: Vitals:  Filed Vitals:   03/16/15 1017  BP: 122/86  Pulse: 88  Resp: 16  Height: 5' 2.75" (1.594 m)  Weight: 159 lb 6.4 oz (72.303 kg)   Body mass index is 28.46 kg/(m^2). No exam data present  Patient is in no distress; well developed, nourished and groomed; neck is supple  CARDIOVASCULAR:  Examination of carotid arteries is normal; no carotid bruits  Regular rate and rhythm, no murmurs  Examination of peripheral vascular system by observation and palpation is normal  EYES:  Ophthalmoscopic exam of optic discs and posterior segments is normal; no papilledema or hemorrhages  MUSCULOSKELETAL:  Gait, strength, tone, movements noted in Neurologic exam below  NEUROLOGIC: MENTAL STATUS:  No flowsheet data found.  awake, alert, oriented to person, place and time  recent and remote memory intact  normal attention and concentration  language fluent, comprehension intact, naming intact,   fund of knowledge appropriate  CRANIAL NERVE:   2nd - no papilledema on fundoscopic exam  2nd,  3rd, 4th, 6th - pupils equal and reactive to light, visual fields full to confrontation, extraocular muscles intact, no nystagmus  5th - facial sensation symmetric  7th - facial strength symmetric  8th - hearing intact  9th - palate elevates symmetrically, uvula midline  11th - shoulder shrug symmetric  12th - tongue protrusion midline  MOTOR:   normal bulk and tone, full strength in the BUE, BLE  SENSORY:   normal and symmetric to light touch, pinprick, temperature, vibration  COORDINATION:   finger-nose-finger, fine finger movements normal  REFLEXES:   deep tendon reflexes present and symmetric  GAIT/STATION:   narrow based gait; able to walk tandem; romberg is negative    DIAGNOSTIC DATA (LABS, IMAGING, TESTING) - I reviewed patient records, labs, notes, testing and imaging myself where available.  Lab Results  Component Value Date   WBC 12.9* 09/15/2009   HGB 14.3 01/23/2015   HCT  42.0 01/23/2015   MCV 85.5 09/15/2009   PLT 163 09/15/2009      Component Value Date/Time   NA 140 01/23/2015 1928   K 3.9 01/23/2015 1928   CL 105 01/23/2015 1928   GLUCOSE 95 01/23/2015 1928   BUN 9 01/23/2015 1928   CREATININE 0.60 01/23/2015 1928   No results found for: CHOL, HDL, LDLCALC, LDLDIRECT, TRIG, CHOLHDL No results found for: ZOXW9U No results found for: VITAMINB12 No results found for: TSH   01/27/15 Skull xray [I reviewed images myself and agree with interpretation. -VRP]  1. No metallic foreign bodies. Patient cleared for MRI. 2. Questionable lytic lesion left parietal occipital region. Further evaluation with enhanced MRI or CT suggested.  02/07/15 MRI brain [I reviewed images myself and agree with interpretation. 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 year old female here with history of migraine with aura since age 41 years old, with more than one  year of episodic migraine 3 times per week, now with increasing severity and frequency of headaches. Most likely represents migraine phenomenon. Continue preventative therapy with topiramate. Continue rizatriptan as needed for breakthrough headaches. May also use naproxen, ibuprofen and acetaminophen in combination with triptan.   Dx: Migraine with aura and without status migrainosus, not intractable --> stable/slightly improving    PLAN: I spent 25 minutes of face to face time with patient. Greater than 50% of time was spent in counseling and coordination of care with patient. In summary we discussed:  - continue topiramate  at bedtime; may increase to  at bedtime - continue maxalt as needed for breakthrough migraines; can take with aleve, ibuprofen or tylenol - monitor headaches with headache journal/diary  Return in about 3 months (around 06/15/2015).    Suanne Marker, MD 03/16/2015, 10:36 AM Certified in Neurology, Neurophysiology and Neuroimaging  [facility] [address], [address] [place], [place] [address] [phone]

## 2015-03-16 NOTE — Patient Instructions (Signed)
Thank you for coming to see [place] at [facility]. I hope we have been able to provide you high quality care today.  You may receive a patient satisfaction survey over the next few weeks. We would appreciate your feedback and comments so that we may continue to improve ourselves and the health of our patients.  - continue topiramate 70m at bedtime; may increase to 1097mat bedtime - continue maxalt as needed for breakthrough migraines; can take with aleve, ibuprofen or tylenol - monitor headaches with headache journal/diary   ~~~~~~~~~~~~~~~~~~~~~~~~~~~~~~~~~~~~~~~~~~~~~~~~~~~~~~~~~~~~~~~~~  DR. PENUMALLI'S GUIDE TO HAPPY AND HEALTHY LIVING These are some of my general health and wellness recommendations. Some of them may apply to you better than others. Please use common sense as you try these suggestions and feel free to ask me any questions.   ACTIVITY/FITNESS Mental, social, emotional and physical stimulation are very important for brain and body health. Try learning a new activity (arts, music, language, sports, games).  Keep moving your body to the best of your abilities. You can do this at home, inside or outside, the park, community center, gym or anywhere you like. Consider a physical therapist or personal trainer to get started. Consider the app Sworkit. Fitness trackers such as smart-watches, smart-phones or Fitbits can help as well.   NUTRITION Eat more plants: colorful vegetables, nuts, seeds and berries.  Eat less sugar, salt, preservatives and processed foods.  Avoid toxins such as cigarettes and alcohol.  Drink water when you are thirsty. Warm water with a slice of lemon is an excellent morning drink to start the day.  Consider these websites for more information The Nutrition Source (hthttps://www.henry-hernandez.biz/[employer] Nutrition (wwWindowBlog.ch  RELAXATION Consider practicing mindfulness  meditation or other relaxation techniques such as deep breathing, prayer, yoga, tai chi, massage. See website mindful.org or the apps Headspace or Calm to help get started.   SLEEP Try to get at least 7-8+ hours sleep per day. Regular exercise and reduced caffeine will help you sleep better. Practice good sleep hygeine techniques. See website sleep.org for more information.   PLANNING Prepare estate planning, living will, healthcare POA documents. Sometimes this is best planned with the help of an attorney. Theconversationproject.org and agingwithdignity.org are excellent resources.

## 2015-03-21 ENCOUNTER — Other Ambulatory Visit: Payer: Self-pay

## 2015-03-21 MED ORDER — TOPIRAMATE 50 MG PO TABS: 50.0000 mg | ORAL_TABLET | Freq: Two times a day (BID) | ORAL | Status: DC

## 2015-03-21 NOTE — Telephone Encounter (Signed)
Pharmacy requests 90 day Rx  

## 2015-06-07 ENCOUNTER — Encounter: Payer: Self-pay | Admitting: Diagnostic Neuroimaging

## 2015-06-07 ENCOUNTER — Telehealth: Payer: Self-pay | Admitting: Diagnostic Neuroimaging

## 2015-06-07 NOTE — Telephone Encounter (Signed)
Pt called 3x to r/s 2/28 appt.  Advised pt in VM that appt was cx'd and can be r/s with CM or Dr. Marjory Lies.-SLB

## 2015-06-15 ENCOUNTER — Ambulatory Visit: Payer: 59 | Admitting: Diagnostic Neuroimaging

## 2015-06-29 ENCOUNTER — Ambulatory Visit: Payer: 59 | Admitting: Diagnostic Neuroimaging

## 2016-06-18 ENCOUNTER — Ambulatory Visit (HOSPITAL_COMMUNITY)
Admission: EM | Admit: 2016-06-18 | Discharge: 2016-06-18 | Disposition: A | Discharge status: Home / Self Care | Payer: 59 | Attending: Internal Medicine | Admitting: Internal Medicine

## 2016-06-18 ENCOUNTER — Encounter (HOSPITAL_COMMUNITY): Payer: Self-pay | Admitting: Emergency Medicine

## 2016-06-18 DIAGNOSIS — M797 Fibromyalgia: Secondary | ICD-10-CM | POA: Diagnosis not present

## 2016-06-18 DIAGNOSIS — G4489 Other headache syndrome: Secondary | ICD-10-CM

## 2016-06-18 MED ORDER — KETOROLAC TROMETHAMINE 60 MG/2ML IM SOLN
60.0000 mg | Freq: Once | INTRAMUSCULAR | Status: AC
  Administered 2016-06-18: 60 mg via INTRAMUSCULAR

## 2016-06-18 MED ORDER — DULOXETINE HCL 60 MG PO CPEP: 60.0000 mg | ORAL_CAPSULE | Freq: Every day | ORAL | 0 refills | Status: AC

## 2016-06-18 MED ORDER — RIZATRIPTAN BENZOATE 10 MG PO TABS: 10.0000 mg | ORAL_TABLET | ORAL | 0 refills | Status: AC | PRN

## 2016-06-18 MED ORDER — KETOROLAC TROMETHAMINE 60 MG/2ML IM SOLN
INTRAMUSCULAR | Status: AC
  Filled 2016-06-18: qty 2

## 2016-06-18 MED ORDER — CLONAZEPAM 0.5 MG PO TABS: 0.5000 mg | ORAL_TABLET | Freq: Two times a day (BID) | ORAL | 0 refills | Status: AC | PRN

## 2016-06-18 NOTE — ED Triage Notes (Signed)
[facility] with a complaint of muscle spasms in her neck and left shoulder x 1 week.

## 2016-06-18 NOTE — Discharge Instructions (Addendum)
Toradol injection given today to help with headache and pain in neck/shoulders.  Establish care with a new primary care provider as soon as possible for ongoing management of these conditions.  Referral to physical therapy made for neck/shoulder pain and headache.

## 2016-06-18 NOTE — ED Provider Notes (Signed)
[facility]    CSN: [id number] Arrival date & time: 06/18/16  1426     History   Chief Complaint Chief Complaint  Patient presents with  . Spasms    HPI Melissa Walter is a 41 y.o. female. She has past medical history notable for headaches and fibromyalgia. She has been out of her medications for about a month. Her primary care provider was Tomi BambergerSusan Fuller, whose office is reportedly now closed. She has not felt well over that time. The specific medicines that she is out of include rizatriptan, duloxetine, and clonazepam. She has a little bit of tizanidine left.  The tizanidine comes from a different provider, who has also given her Voltaren cream. She is having a lot of difficulty at present with pain and stiffness in her neck and shoulders, and occipital headache. She is a little bit tearful. She was able to drive herself to the urgent care today, and walk independently into the facility.    HPI  Past Medical History:  Diagnosis Date  . Anxiety   . Arthritis   . Bursitis   . Depression   . Fibromyalgia   . Frequent headaches   . Panic attacks   . Scoliosis     Patient Active Problem List   Diagnosis Date Noted  . Anxiety 03/16/2015  . Arthritis 03/16/2015  . Clinical depression 03/16/2015  . Fibromyalgia 03/16/2015  . Migraine with aura and without status migrainosus, not intractable 03/16/2015  . Current tobacco use 01/29/2014    Past Surgical History:  Procedure Laterality Date  . CESAREAN SECTION     x 3  . MULTIPLE TOOTH EXTRACTIONS     6 teeth  . tubal ligation       Home Medications    Prior to Admission medications   Medication Sig Start Date End Date Taking? Authorizing Provider  clonazePAM (KLONOPIN) 0.5 MG tablet Take 1 tablet (0.5 mg total) by mouth 2 (two) times daily as needed for anxiety. 06/18/16 06/25/16  Eustace MooreLaura W Damika Harmon, MD  DULoxetine (CYMBALTA) 60 MG capsule Take 1 capsule (60 mg total) by mouth daily. 06/18/16   Eustace MooreLaura W Shawneequa Baldridge, MD    rizatriptan (MAXALT) 10 MG tablet Take 1 tablet (10 mg total) by mouth as needed for migraine. 06/18/16   Eustace MooreLaura W Valjean Ruppel, MD  tiZANidine (ZANAFLEX) 4 MG tablet  03/02/15   Historical Provider, MD    Family History Family History  Problem Relation Age of Onset  . Fibromyalgia Mother   . Arthritis Mother   . Cancer - Colon Maternal Aunt   . Stroke Paternal Aunt   . Cancer Paternal Aunt     breast  . Arthritis Maternal Grandmother   . Diabetes Maternal Grandmother   . Alzheimer's disease Maternal Grandmother   . Fibromyalgia Paternal Grandmother   . Arthritis Paternal Grandmother     Social History Social History  Substance Use Topics  . Smoking status: Current Every Day Smoker    Packs/day: 1.00    Years: 21.00  . Smokeless tobacco: Not on file  . Alcohol use Yes     Comment: rare     Allergies   Amoxicillin and Latex   Review of Systems Review of Systems  All other systems reviewed and are negative.    Physical Exam Triage Vital Signs ED Triage Vitals  Enc Vitals Group     BP 06/18/16 1549 151/69     Pulse Rate 06/18/16 1549 102     Resp 06/18/16  1549 20     Temp 06/18/16 1549 99.1 F (37.3 C)     Temp Source 06/18/16 1549 Oral     SpO2 06/18/16 1549 100 %     Weight --      Height --      Pain Score 06/18/16 1548 6     Pain Loc --    Updated Vital Signs BP 151/69 (BP Location: Right Arm)   Pulse 102   Temp 99.1 F (37.3 C) (Oral)   Resp 20   SpO2 100%   Physical Exam  Constitutional: She is oriented to person, place, and time.  Alert, nicely groomed She is leaning over the exam table, seated in the bedside chair, with the room darkened. She looks tearful.  HENT:  Head: Atraumatic.  Eyes:  Conjugate gaze, no eye redness/drainage  Neck: Neck supple.  Able to turn her head, but with pain  Cardiovascular: Normal rate.   Pulmonary/Chest: No respiratory distress.  Abdominal: She exhibits no distension.  Musculoskeletal: Normal range of motion.   No leg swelling  Neurological: She is alert and oriented to person, place, and time.  Skin: Skin is warm and dry.  No cyanosis  Nursing note and vitals reviewed.    UC Treatments / Results   Procedures Procedures (including critical care time)  Medications Ordered in UC Medications  ketorolac (TORADOL) injection 60 mg (60 mg Intramuscular Given 06/18/16 1701)    Final Clinical Impressions(s) / UC Diagnoses   Final diagnoses:  Fibromyalgia affecting multiple sites  Headache syndrome   Toradol injection given today to help with headache and pain in neck/shoulders.  Establish care with a new primary care provider as soon as possible for ongoing management of these conditions.  Referral to physical therapy made for neck/shoulder pain and headache.   New Prescriptions Current Discharge Medication List       Eustace Moore, MD 06/20/16 2105

## 2016-07-26 ENCOUNTER — Other Ambulatory Visit: Payer: Self-pay | Admitting: Rheumatology

## 2016-07-26 ENCOUNTER — Telehealth: Payer: Self-pay | Admitting: Rheumatology

## 2016-07-26 NOTE — Telephone Encounter (Signed)
ok 

## 2016-07-26 NOTE — Telephone Encounter (Signed)
Left message on machine for patient to call the office back to schedule her fu appointment. °

## 2016-07-26 NOTE — Telephone Encounter (Signed)
Last Visit: 12/06/15 Next Visit was due February 2018. Message sent to the front to schedule patient.  Okay to refill Zanaflex?  

## 2016-07-26 NOTE — Telephone Encounter (Signed)
-----   Message from Henriette Combs, LPN sent at 1/61/0960  8:47 AM EDT ----- Regarding: Please schedule patient for follow up visit Please schedule patient for follow up visit. Patient was due February 2018. Thanks!

## 2016-08-03 ENCOUNTER — Telehealth: Payer: Self-pay | Admitting: Rheumatology

## 2016-08-03 NOTE — Telephone Encounter (Signed)
-----   Message from Melissa Combs, LPN sent at 1/61/0960  8:47 AM EDT ----- Regarding: Please schedule patient for follow up visit Please schedule patient for follow up visit. Patient was due February 2018. Thanks!

## 2016-08-03 NOTE — Telephone Encounter (Signed)
Left message on machine for patient to call the office back to schedule her fu appointment. °

## 2017-03-15 ENCOUNTER — Other Ambulatory Visit: Payer: Self-pay | Admitting: Rheumatology

## 2017-03-15 NOTE — Telephone Encounter (Signed)
Last Visit: 12/06/15 Next Visit was due February 2018. Message sent to the front to schedule patient.  Okay to refill Zanaflex?

## 2017-03-15 NOTE — Telephone Encounter (Signed)
ok 

## 2017-04-06 ENCOUNTER — Telehealth: Payer: Self-pay | Admitting: Rheumatology

## 2017-04-06 NOTE — Telephone Encounter (Signed)
I LMOM for patient to call, and schedule rov with Dr. Corliss Skainseveshwar  for 05/2017.

## 2017-04-06 NOTE — Telephone Encounter (Signed)
-----   Message from Henriette CombsAndrea L Hatton, LPN sent at [id number]/540911/29/2018  8:29 AM EST ----- Regarding: Please scheduel patient a follow up visit Please schedule patient for follow up visit. Patient was due February 2018. Thanks!

## 2017-07-16 ENCOUNTER — Other Ambulatory Visit: Payer: Self-pay | Admitting: Rheumatology

## 2017-08-30 ENCOUNTER — Ambulatory Visit (HOSPITAL_COMMUNITY): Payer: Worker's Compensation

## 2017-08-30 ENCOUNTER — Encounter (HOSPITAL_COMMUNITY): Payer: Self-pay | Admitting: Emergency Medicine

## 2017-08-30 ENCOUNTER — Ambulatory Visit (HOSPITAL_COMMUNITY)
Admission: EM | Admit: 2017-08-30 | Discharge: 2017-08-30 | Disposition: A | Discharge status: Home / Self Care | Payer: Worker's Compensation | Attending: Family Medicine | Admitting: Family Medicine

## 2017-08-30 ENCOUNTER — Other Ambulatory Visit: Payer: Self-pay

## 2017-08-30 DIAGNOSIS — M659 Synovitis and tenosynovitis, unspecified: Secondary | ICD-10-CM

## 2017-08-30 MED ORDER — DICLOFENAC SODIUM 75 MG PO TBEC: 75.0000 mg | DELAYED_RELEASE_TABLET | Freq: Two times a day (BID) | ORAL | 0 refills | Status: AC

## 2017-08-30 NOTE — Discharge Instructions (Addendum)
Usually the thumb is sore and swollen for 2 days.  It may stay black and blue for a week.

## 2017-08-30 NOTE — ED Triage Notes (Signed)
Patient went to pick up a heavy item, felt pop and then a burning, tingling feeling.  Looked at finger and saw a purple area.  Purple area to right thumb and soreness .  Able to move thumb, but particularly painful to hyper extend right thumb

## 2017-08-30 NOTE — ED Provider Notes (Signed)
[facility]   [id number] 08/30/17 Arrival Time: 1755   SUBJECTIVE:  Melissa Walter is a 42 y.o. female who presents to the urgent care with complaint of Purple area to right thumb and soreness which happened where she works at [employer] when grabbing an object.  Moving the thumb better now.  No skin breaks.  Right hand dominant  Past Medical History:  Diagnosis Date  . Anxiety   . Arthritis   . Bursitis   . Depression   . Fibromyalgia   . Frequent headaches   . Panic attacks   . Scoliosis    Family History  Problem Relation Age of Onset  . Fibromyalgia Mother   . Arthritis Mother   . Cancer - Colon Maternal Aunt   . Stroke Paternal Aunt   . Cancer Paternal Aunt        breast  . Arthritis Maternal Grandmother   . Diabetes Maternal Grandmother   . Alzheimer's disease Maternal Grandmother   . Fibromyalgia Paternal Grandmother   . Arthritis Paternal Grandmother    Social History   Socioeconomic History  . Marital status: Married    Spouse name: Chrissie Noa  . Number of children: 3  . Years of education: 13.5  . Highest education level: Not on file  Occupational History    Comment: [employer]  Social Needs  . Financial resource strain: Not on file  . Food insecurity:    Worry: Not on file    Inability: Not on file  . Transportation needs:    Medical: Not on file    Non-medical: Not on file  Tobacco Use  . Smoking status: Current Every Day Smoker    Packs/day: 1.00    Years: 21.00    Pack years: 21.00  Substance and Sexual Activity  . Alcohol use: Yes    Comment: rare  . Drug use: No  . Sexual activity: Not on file  Lifestyle  . Physical activity:    Days per week: Not on file    Minutes per session: Not on file  . Stress: Not on file  Relationships  . Social connections:    Talks on phone: Not on file    Gets together: Not on file    Attends religious service: Not on file    Active member of club or organization: Not on file    Attends  meetings of clubs or organizations: Not on file    Relationship status: Not on file  . Intimate partner violence:    Fear of current or ex partner: Not on file    Emotionally abused: Not on file    Physically abused: Not on file    Forced sexual activity: Not on file  Other Topics Concern  . Not on file  Social History Narrative   Lives at home with husband, 3 children    caffeine use- coffee, 2 cups daily   No outpatient medications have been marked as taking for the 08/30/17 encounter [facility] Encounter).   Allergies  Allergen Reactions  . Amoxicillin Hives, Itching and Nausea And Vomiting  . Latex Swelling    blisters      ROS: As per HPI, remainder of ROS negative.   OBJECTIVE:   Vitals:   08/30/17 1843  BP: 132/80  Pulse: 79  Resp: 20  Temp: 99.2 F (37.3 C)  TempSrc: Oral  SpO2: 98%     General appearance: alert; no distress Eyes: PERRL; EOMI; conjunctiva normal HENT: normocephalic; atraumatic;  oral mucosa normal Neck: supple Back: no CVA tenderness Extremities: no cyanosis or edema; mild swelling, ecchymosis, tenderness over the right thumb volar MCP joint.  Good range of motion. Skin: warm and dry Neurologic: normal gait; grossly normal Psychological: alert and cooperative; normal mood and affect      Labs:  Results for orders placed or performed during the hospital encounter of 01/23/15  I-STAT, chem 8  Result Value Ref Range   Sodium 140 135 - 145 mmol/L   Potassium 3.9 3.5 - 5.1 mmol/L   Chloride 105 101 - 111 mmol/L   BUN 9 6 - 20 mg/dL   Creatinine, Ser 1.61 0.44 - 1.00 mg/dL   Glucose, Bld 95 65 - 99 mg/dL   Calcium, Ion 0.96 0.45 - 1.23 mmol/L   TCO2 24 0 - 100 mmol/L   Hemoglobin 14.3 12.0 - 15.0 g/dL   HCT 40.9 81.1 - 91.4 %    Labs Reviewed - No data to display  No results found.     ASSESSMENT & PLAN:  1. Tenosynovitis of right hand   Usually the thumb is sore and swollen for 2 days.  It may stay black and blue for  a week.   Meds ordered this encounter  Medications  . diclofenac (VOLTAREN) 75 MG EC tablet    Sig: Take 1 tablet (75 mg total) by mouth 2 (two) times daily.    Dispense:  14 tablet    Refill:  0    Reviewed expectations re: course of current medical issues. Questions answered. Outlined signs and symptoms indicating need for more acute intervention. Patient verbalized understanding. After Visit Summary given.    Procedures:      Elvina Sidle, MD 08/30/17 [phone]

## 2017-09-13 ENCOUNTER — Other Ambulatory Visit: Payer: Self-pay | Admitting: Rheumatology

## 2018-10-10 DIAGNOSIS — Z20828 Contact with and (suspected) exposure to other viral communicable diseases: Secondary | ICD-10-CM | POA: Diagnosis not present

## 2018-12-21 DIAGNOSIS — R69 Illness, unspecified: Secondary | ICD-10-CM | POA: Diagnosis not present

## 2019-04-27 ENCOUNTER — Ambulatory Visit (HOSPITAL_COMMUNITY)
Admission: EM | Admit: 2019-04-27 | Discharge: 2019-04-27 | Disposition: A | Discharge status: Home / Self Care | Payer: No Typology Code available for payment source | Attending: Family Medicine | Admitting: Family Medicine

## 2019-04-27 ENCOUNTER — Other Ambulatory Visit: Payer: Self-pay

## 2019-04-27 ENCOUNTER — Encounter (HOSPITAL_COMMUNITY): Payer: Self-pay | Admitting: Emergency Medicine

## 2019-04-27 DIAGNOSIS — Z20822 Contact with and (suspected) exposure to covid-19: Secondary | ICD-10-CM | POA: Diagnosis not present

## 2019-04-27 DIAGNOSIS — Z8261 Family history of arthritis: Secondary | ICD-10-CM | POA: Insufficient documentation

## 2019-04-27 DIAGNOSIS — Z791 Long term (current) use of non-steroidal anti-inflammatories (NSAID): Secondary | ICD-10-CM | POA: Insufficient documentation

## 2019-04-27 DIAGNOSIS — F410 Panic disorder [episodic paroxysmal anxiety] without agoraphobia: Secondary | ICD-10-CM | POA: Insufficient documentation

## 2019-04-27 DIAGNOSIS — Z833 Family history of diabetes mellitus: Secondary | ICD-10-CM | POA: Diagnosis not present

## 2019-04-27 DIAGNOSIS — Z91040 Latex allergy status: Secondary | ICD-10-CM | POA: Insufficient documentation

## 2019-04-27 DIAGNOSIS — M1990 Unspecified osteoarthritis, unspecified site: Secondary | ICD-10-CM | POA: Insufficient documentation

## 2019-04-27 DIAGNOSIS — Z800 Family history of malignant neoplasm of digestive organs: Secondary | ICD-10-CM | POA: Insufficient documentation

## 2019-04-27 DIAGNOSIS — Z880 Allergy status to penicillin: Secondary | ICD-10-CM | POA: Diagnosis not present

## 2019-04-27 DIAGNOSIS — M419 Scoliosis, unspecified: Secondary | ICD-10-CM | POA: Diagnosis not present

## 2019-04-27 DIAGNOSIS — Z79899 Other long term (current) drug therapy: Secondary | ICD-10-CM | POA: Diagnosis not present

## 2019-04-27 DIAGNOSIS — Z803 Family history of malignant neoplasm of breast: Secondary | ICD-10-CM | POA: Diagnosis not present

## 2019-04-27 DIAGNOSIS — Z03818 Encounter for observation for suspected exposure to other biological agents ruled out: Secondary | ICD-10-CM | POA: Diagnosis not present

## 2019-04-27 DIAGNOSIS — Z823 Family history of stroke: Secondary | ICD-10-CM | POA: Insufficient documentation

## 2019-04-27 DIAGNOSIS — F17210 Nicotine dependence, cigarettes, uncomplicated: Secondary | ICD-10-CM | POA: Insufficient documentation

## 2019-04-27 DIAGNOSIS — M797 Fibromyalgia: Secondary | ICD-10-CM | POA: Insufficient documentation

## 2019-04-27 DIAGNOSIS — R0602 Shortness of breath: Secondary | ICD-10-CM | POA: Diagnosis not present

## 2019-04-27 DIAGNOSIS — Z9851 Tubal ligation status: Secondary | ICD-10-CM | POA: Insufficient documentation

## 2019-04-27 DIAGNOSIS — Z8269 Family history of other diseases of the musculoskeletal system and connective tissue: Secondary | ICD-10-CM | POA: Diagnosis not present

## 2019-04-27 DIAGNOSIS — R197 Diarrhea, unspecified: Secondary | ICD-10-CM | POA: Diagnosis not present

## 2019-04-27 DIAGNOSIS — R519 Headache, unspecified: Secondary | ICD-10-CM | POA: Diagnosis not present

## 2019-04-27 DIAGNOSIS — M542 Cervicalgia: Secondary | ICD-10-CM | POA: Insufficient documentation

## 2019-04-27 DIAGNOSIS — I10 Essential (primary) hypertension: Secondary | ICD-10-CM | POA: Diagnosis not present

## 2019-04-27 DIAGNOSIS — Z20828 Contact with and (suspected) exposure to other viral communicable diseases: Secondary | ICD-10-CM | POA: Diagnosis not present

## 2019-04-27 DIAGNOSIS — R69 Illness, unspecified: Secondary | ICD-10-CM | POA: Diagnosis not present

## 2019-04-27 DIAGNOSIS — R1110 Vomiting, unspecified: Secondary | ICD-10-CM | POA: Insufficient documentation

## 2019-04-27 MED ORDER — TRIAMTERENE-HCTZ 37.5-25 MG PO TABS: 1.0000 | ORAL_TABLET | Freq: Every day | ORAL | 1 refills | Status: AC

## 2019-04-27 NOTE — ED Triage Notes (Signed)
Pt reports HBP that started in April.  Pt states she first knew about it when she went to her PCP.  She was not placed on any medications.  Over one week ago, she reports having right neck pain that was causing a mild headache.  Since time the neck pain has eased off a bit but she is continuing to have headaches so she decided to start checking her BP at home.  Pt has been taking her BP at random times.  She has been ranging from 114 to 160's in one day.  She has had some cases of vomiting on Monday and Wednesday with stress and headache. She has had some nausea over the weekend.  Pt had diarrhea 8 days ago.  She also reports sweating and SOB almost daily for at least 8 months. Pt had a rapid covid test this morning at [employer] and it was negative.  Pt denies cough, fever, loss of taste or smell, or sore throat.

## 2019-04-27 NOTE — ED Provider Notes (Signed)
[facility]    CSN: [id number] Arrival date & time: 04/27/19  1137      History   Chief Complaint Chief Complaint  Patient presents with  . Neck Pain  . Headache  . Shortness of Breath  . Excessive Sweating  . Hypertension    HPI Melissa Walter is a 44 y.o. female.   Patient with multiple symptoms including headache neck pain vomiting diarrhea.  Blood pressure has been elevated for the past 6 to 8 months.  She had a rapid Covid test this morning at [employer] and that was negative.  She has not been having cough but has had some shortness of breath and sweating  HPI  Past Medical History:  Diagnosis Date  . Anxiety   . Arthritis   . Bursitis   . Depression   . Fibromyalgia   . Frequent headaches   . Panic attacks   . Scoliosis     Patient Active Problem List   Diagnosis Date Noted  . Anxiety 03/16/2015  . Arthritis 03/16/2015  . Clinical depression 03/16/2015  . Fibromyalgia 03/16/2015  . Migraine with aura and without status migrainosus, not intractable 03/16/2015  . Current tobacco use 01/29/2014    Past Surgical History:  Procedure Laterality Date  . CESAREAN SECTION     x 3  . MULTIPLE TOOTH EXTRACTIONS     6 teeth  . tubal ligation      OB History   No obstetric history on file.      Home Medications    Prior to Admission medications   Medication Sig Start Date End Date Taking? Authorizing Provider  DULoxetine (CYMBALTA) 60 MG capsule Take 1 capsule (60 mg total) by mouth daily. 06/18/16  Yes Isa Rankin, MD  levocetirizine (XYZAL) 5 MG tablet levocetirizine 5 mg tablet   Yes [provider]  rizatriptan (MAXALT) 10 MG tablet Take 1 tablet (10 mg total) by mouth as needed for migraine. 06/18/16  Yes Isa Rankin, MD  clonazePAM (KLONOPIN) 0.5 MG tablet Take 1 tablet (0.5 mg total) by mouth 2 (two) times daily as needed for anxiety. 06/18/16 06/25/16  Isa Rankin, MD  diclofenac (VOLTAREN) 75 MG EC tablet Take 1  tablet (75 mg total) by mouth 2 (two) times daily. 08/30/17   Elvina Sidle, MD  tiZANidine (ZANAFLEX) 4 MG tablet TAKE 1 TABLET BY MOUTH AT BEDTIME 03/15/17   Pollyann Savoy, MD    Family History Family History  Problem Relation Age of Onset  . Fibromyalgia Mother   . Arthritis Mother   . Cancer - Colon Maternal Aunt   . Stroke Paternal Aunt   . Cancer Paternal Aunt        breast  . Arthritis Maternal Grandmother   . Diabetes Maternal Grandmother   . Alzheimer's disease Maternal Grandmother   . Fibromyalgia Paternal Grandmother   . Arthritis Paternal Grandmother     Social History Social History   Tobacco Use  . Smoking status: Current Every Day Smoker    Packs/day: 1.00    Years: 21.00    Pack years: 21.00  . Smokeless tobacco: Never Used  Substance Use Topics  . Alcohol use: Yes    Comment: rare  . Drug use: No     Allergies   Amoxicillin and Latex   Review of Systems Review of Systems  Constitutional: Positive for fever.  Respiratory: Positive for shortness of breath.   Gastrointestinal: Negative.   Genitourinary: Negative.  Musculoskeletal: Negative.   Neurological: Negative.   Psychiatric/Behavioral: Negative.   All other systems reviewed and are negative.    Physical Exam Triage Vital Signs ED Triage Vitals  Enc Vitals Group     BP 04/27/19 1355 (!) 154/76     Pulse Rate 04/27/19 1355 84     Resp 04/27/19 1355 20     Temp 04/27/19 1355 99 F (37.2 C)     Temp Source 04/27/19 1355 Oral     SpO2 04/27/19 1355 99 %     Weight --      Height --      Head Circumference --      Peak Flow --      Pain Score 04/27/19 1349 7     Pain Loc --      Pain Edu? --      Excl. in [place]? --    No data found.  Updated Vital Signs BP (!) 154/76 (BP Location: Left Arm)   Pulse 84   Temp 99 F (37.2 C) (Oral)   Resp 20   LMP 04/18/2019 (Exact Date)   SpO2 99%   Visual Acuity Right Eye Distance:   Left Eye Distance:   Bilateral Distance:     Right Eye Near:   Left Eye Near:    Bilateral Near:     Physical Exam Constitutional:      Appearance: She is well-developed.  HENT:     Head: Normocephalic.  Cardiovascular:     Rate and Rhythm: Normal rate and regular rhythm.  Pulmonary:     Effort: Pulmonary effort is normal.     Breath sounds: Normal breath sounds.  Abdominal:     General: Bowel sounds are normal.     Palpations: Abdomen is soft.  Skin:    General: Skin is warm and dry.  Neurological:     Mental Status: She is alert.  Psychiatric:        Mood and Affect: Mood normal.        Behavior: Behavior normal.      UC Treatments / Results  Labs (all labs ordered are listed, but only abnormal results are displayed) Labs Reviewed  NOVEL CORONAVIRUS, NAA (HOSP ORDER, SEND-OUT TO REF LAB; TAT 18-24 HRS)    EKG   Radiology No results found.  Procedures Procedures (including critical care time)  Medications Ordered in UC Medications - No data to display  Initial Impression / Assessment and Plan / UC Course  I have reviewed the triage vital signs and the nursing notes.  Pertinent labs & imaging results that were available during my care of the patient were reviewed by me and considered in my medical decision making (see chart for details).     With constellation of symptoms need PCR testing for Covid. Hypertension will initiate treatment with diuretic and asked her to follow-up with PCP Final Clinical Impressions(s) / UC Diagnoses   Final diagnoses:  None   Discharge Instructions   None    ED Prescriptions    None     PDMP not reviewed this encounter.   Wardell Honour, MD 04/27/19 [phone]

## 2019-04-28 LAB — NOVEL CORONAVIRUS, NAA (HOSP ORDER, SEND-OUT TO REF LAB; TAT 18-24 HRS): SARS-CoV-2, NAA: NOT DETECTED

## 2019-04-29 DIAGNOSIS — Z13220 Encounter for screening for lipoid disorders: Secondary | ICD-10-CM | POA: Diagnosis not present

## 2019-04-29 DIAGNOSIS — I10 Essential (primary) hypertension: Secondary | ICD-10-CM | POA: Diagnosis not present

## 2019-05-02 DIAGNOSIS — I10 Essential (primary) hypertension: Secondary | ICD-10-CM | POA: Diagnosis not present

## 2019-05-02 DIAGNOSIS — Z13220 Encounter for screening for lipoid disorders: Secondary | ICD-10-CM | POA: Diagnosis not present

## 2019-05-03 ENCOUNTER — Encounter (INDEPENDENT_AMBULATORY_CARE_PROVIDER_SITE_OTHER): Payer: Self-pay

## 2019-05-16 DIAGNOSIS — R69 Illness, unspecified: Secondary | ICD-10-CM | POA: Diagnosis not present

## 2019-05-16 DIAGNOSIS — G43909 Migraine, unspecified, not intractable, without status migrainosus: Secondary | ICD-10-CM | POA: Diagnosis not present

## 2019-05-19 ENCOUNTER — Other Ambulatory Visit: Payer: Self-pay | Admitting: Family Medicine

## 2019-05-27 DIAGNOSIS — I10 Essential (primary) hypertension: Secondary | ICD-10-CM | POA: Diagnosis not present

## 2019-06-26 DIAGNOSIS — I10 Essential (primary) hypertension: Secondary | ICD-10-CM | POA: Diagnosis not present

## 2019-06-26 DIAGNOSIS — Z6829 Body mass index (BMI) 29.0-29.9, adult: Secondary | ICD-10-CM | POA: Diagnosis not present

## 2019-06-26 DIAGNOSIS — R69 Illness, unspecified: Secondary | ICD-10-CM | POA: Diagnosis not present

## 2019-06-26 DIAGNOSIS — Z79899 Other long term (current) drug therapy: Secondary | ICD-10-CM | POA: Diagnosis not present

## 2019-06-26 DIAGNOSIS — Z7689 Persons encountering health services in other specified circumstances: Secondary | ICD-10-CM | POA: Diagnosis not present

## 2019-07-23 DIAGNOSIS — R69 Illness, unspecified: Secondary | ICD-10-CM | POA: Diagnosis not present

## 2019-07-25 DIAGNOSIS — R69 Illness, unspecified: Secondary | ICD-10-CM | POA: Diagnosis not present

## 2019-07-25 DIAGNOSIS — G4701 Insomnia due to medical condition: Secondary | ICD-10-CM | POA: Diagnosis not present

## 2019-07-28 DIAGNOSIS — G4701 Insomnia due to medical condition: Secondary | ICD-10-CM | POA: Diagnosis not present

## 2019-07-28 DIAGNOSIS — R69 Illness, unspecified: Secondary | ICD-10-CM | POA: Diagnosis not present

## 2019-08-14 DIAGNOSIS — F308 Other manic episodes: Secondary | ICD-10-CM | POA: Diagnosis not present

## 2019-08-14 DIAGNOSIS — R69 Illness, unspecified: Secondary | ICD-10-CM | POA: Diagnosis not present

## 2019-08-27 DIAGNOSIS — R69 Illness, unspecified: Secondary | ICD-10-CM | POA: Diagnosis not present

## 2019-08-27 DIAGNOSIS — F4312 Post-traumatic stress disorder, chronic: Secondary | ICD-10-CM | POA: Diagnosis not present

## 2019-09-04 DIAGNOSIS — R69 Illness, unspecified: Secondary | ICD-10-CM | POA: Diagnosis not present

## 2019-09-04 DIAGNOSIS — F4312 Post-traumatic stress disorder, chronic: Secondary | ICD-10-CM | POA: Diagnosis not present

## 2019-09-11 DIAGNOSIS — F4312 Post-traumatic stress disorder, chronic: Secondary | ICD-10-CM | POA: Diagnosis not present

## 2019-09-11 DIAGNOSIS — R69 Illness, unspecified: Secondary | ICD-10-CM | POA: Diagnosis not present

## 2019-09-16 DIAGNOSIS — R002 Palpitations: Secondary | ICD-10-CM | POA: Diagnosis not present

## 2019-09-16 DIAGNOSIS — R69 Illness, unspecified: Secondary | ICD-10-CM | POA: Diagnosis not present

## 2019-09-25 DIAGNOSIS — F4312 Post-traumatic stress disorder, chronic: Secondary | ICD-10-CM | POA: Diagnosis not present

## 2019-09-25 DIAGNOSIS — R69 Illness, unspecified: Secondary | ICD-10-CM | POA: Diagnosis not present

## 2019-10-09 DIAGNOSIS — R69 Illness, unspecified: Secondary | ICD-10-CM | POA: Diagnosis not present

## 2019-10-09 DIAGNOSIS — F4312 Post-traumatic stress disorder, chronic: Secondary | ICD-10-CM | POA: Diagnosis not present

## 2019-10-24 DIAGNOSIS — R69 Illness, unspecified: Secondary | ICD-10-CM | POA: Diagnosis not present

## 2019-10-24 DIAGNOSIS — F4312 Post-traumatic stress disorder, chronic: Secondary | ICD-10-CM | POA: Diagnosis not present

## 2019-11-05 DIAGNOSIS — R69 Illness, unspecified: Secondary | ICD-10-CM | POA: Diagnosis not present

## 2019-11-05 DIAGNOSIS — F4312 Post-traumatic stress disorder, chronic: Secondary | ICD-10-CM | POA: Diagnosis not present

## 2019-11-06 DIAGNOSIS — R69 Illness, unspecified: Secondary | ICD-10-CM | POA: Diagnosis not present

## 2019-11-06 DIAGNOSIS — F4312 Post-traumatic stress disorder, chronic: Secondary | ICD-10-CM | POA: Diagnosis not present

## 2019-12-04 DIAGNOSIS — R69 Illness, unspecified: Secondary | ICD-10-CM | POA: Diagnosis not present

## 2019-12-04 DIAGNOSIS — F4312 Post-traumatic stress disorder, chronic: Secondary | ICD-10-CM | POA: Diagnosis not present

## 2019-12-10 DIAGNOSIS — F4312 Post-traumatic stress disorder, chronic: Secondary | ICD-10-CM | POA: Diagnosis not present

## 2019-12-10 DIAGNOSIS — R69 Illness, unspecified: Secondary | ICD-10-CM | POA: Diagnosis not present

## 2019-12-18 DIAGNOSIS — F4312 Post-traumatic stress disorder, chronic: Secondary | ICD-10-CM | POA: Diagnosis not present

## 2019-12-18 DIAGNOSIS — R69 Illness, unspecified: Secondary | ICD-10-CM | POA: Diagnosis not present

## 2019-12-31 DIAGNOSIS — R69 Illness, unspecified: Secondary | ICD-10-CM | POA: Diagnosis not present

## 2019-12-31 DIAGNOSIS — F4312 Post-traumatic stress disorder, chronic: Secondary | ICD-10-CM | POA: Diagnosis not present

## 2020-01-01 DIAGNOSIS — R69 Illness, unspecified: Secondary | ICD-10-CM | POA: Diagnosis not present

## 2020-01-01 DIAGNOSIS — F4312 Post-traumatic stress disorder, chronic: Secondary | ICD-10-CM | POA: Diagnosis not present

## 2020-01-15 DIAGNOSIS — R69 Illness, unspecified: Secondary | ICD-10-CM | POA: Diagnosis not present

## 2020-01-15 DIAGNOSIS — F4312 Post-traumatic stress disorder, chronic: Secondary | ICD-10-CM | POA: Diagnosis not present

## 2020-01-16 DIAGNOSIS — Z1331 Encounter for screening for depression: Secondary | ICD-10-CM | POA: Diagnosis not present

## 2020-01-16 DIAGNOSIS — F319 Bipolar disorder, unspecified: Secondary | ICD-10-CM | POA: Diagnosis not present

## 2020-01-16 DIAGNOSIS — R69 Illness, unspecified: Secondary | ICD-10-CM | POA: Diagnosis not present

## 2020-02-03 DIAGNOSIS — R002 Palpitations: Secondary | ICD-10-CM | POA: Diagnosis not present

## 2020-02-03 DIAGNOSIS — R0789 Other chest pain: Secondary | ICD-10-CM | POA: Diagnosis not present

## 2020-02-03 DIAGNOSIS — I517 Cardiomegaly: Secondary | ICD-10-CM | POA: Diagnosis not present

## 2020-02-03 DIAGNOSIS — R000 Tachycardia, unspecified: Secondary | ICD-10-CM | POA: Diagnosis not present

## 2020-02-12 DIAGNOSIS — F4312 Post-traumatic stress disorder, chronic: Secondary | ICD-10-CM | POA: Diagnosis not present

## 2020-02-12 DIAGNOSIS — R69 Illness, unspecified: Secondary | ICD-10-CM | POA: Diagnosis not present

## 2020-02-12 DIAGNOSIS — F410 Panic disorder [episodic paroxysmal anxiety] without agoraphobia: Secondary | ICD-10-CM | POA: Diagnosis not present

## 2020-02-16 DIAGNOSIS — G4710 Hypersomnia, unspecified: Secondary | ICD-10-CM | POA: Diagnosis not present

## 2020-02-19 DIAGNOSIS — G4710 Hypersomnia, unspecified: Secondary | ICD-10-CM | POA: Diagnosis not present

## 2020-02-23 DIAGNOSIS — Z716 Tobacco abuse counseling: Secondary | ICD-10-CM | POA: Diagnosis not present

## 2020-03-01 DIAGNOSIS — R0789 Other chest pain: Secondary | ICD-10-CM | POA: Diagnosis not present

## 2020-03-01 DIAGNOSIS — R002 Palpitations: Secondary | ICD-10-CM | POA: Diagnosis not present

## 2020-03-03 DIAGNOSIS — R69 Illness, unspecified: Secondary | ICD-10-CM | POA: Diagnosis not present

## 2020-03-03 DIAGNOSIS — F410 Panic disorder [episodic paroxysmal anxiety] without agoraphobia: Secondary | ICD-10-CM | POA: Diagnosis not present

## 2020-03-03 DIAGNOSIS — F4312 Post-traumatic stress disorder, chronic: Secondary | ICD-10-CM | POA: Diagnosis not present

## 2020-03-24 DIAGNOSIS — Z1231 Encounter for screening mammogram for malignant neoplasm of breast: Secondary | ICD-10-CM | POA: Diagnosis not present

## 2020-03-24 DIAGNOSIS — Z1211 Encounter for screening for malignant neoplasm of colon: Secondary | ICD-10-CM | POA: Diagnosis not present

## 2020-03-24 DIAGNOSIS — G43909 Migraine, unspecified, not intractable, without status migrainosus: Secondary | ICD-10-CM | POA: Diagnosis not present

## 2020-03-24 DIAGNOSIS — K219 Gastro-esophageal reflux disease without esophagitis: Secondary | ICD-10-CM | POA: Diagnosis not present

## 2020-03-24 DIAGNOSIS — J302 Other seasonal allergic rhinitis: Secondary | ICD-10-CM | POA: Diagnosis not present

## 2020-03-24 DIAGNOSIS — R69 Illness, unspecified: Secondary | ICD-10-CM | POA: Diagnosis not present

## 2020-03-24 DIAGNOSIS — I10 Essential (primary) hypertension: Secondary | ICD-10-CM | POA: Diagnosis not present

## 2020-03-24 DIAGNOSIS — Z0000 Encounter for general adult medical examination without abnormal findings: Secondary | ICD-10-CM | POA: Diagnosis not present

## 2020-03-25 ENCOUNTER — Other Ambulatory Visit: Payer: Self-pay | Admitting: Urgent Care

## 2020-03-25 DIAGNOSIS — Z1231 Encounter for screening mammogram for malignant neoplasm of breast: Secondary | ICD-10-CM

## 2020-03-26 ENCOUNTER — Ambulatory Visit
Admission: RE | Admit: 2020-03-26 | Discharge: 2020-03-26 | Disposition: A | Discharge status: Home / Self Care | Payer: No Typology Code available for payment source | Source: Ambulatory Visit | Attending: Urgent Care | Admitting: Urgent Care

## 2020-03-26 ENCOUNTER — Other Ambulatory Visit: Payer: Self-pay

## 2020-03-26 DIAGNOSIS — Z1231 Encounter for screening mammogram for malignant neoplasm of breast: Secondary | ICD-10-CM | POA: Diagnosis not present

## 2020-03-31 DIAGNOSIS — R69 Illness, unspecified: Secondary | ICD-10-CM | POA: Diagnosis not present

## 2020-03-31 DIAGNOSIS — F410 Panic disorder [episodic paroxysmal anxiety] without agoraphobia: Secondary | ICD-10-CM | POA: Diagnosis not present

## 2020-03-31 DIAGNOSIS — F4312 Post-traumatic stress disorder, chronic: Secondary | ICD-10-CM | POA: Diagnosis not present

## 2020-04-26 DIAGNOSIS — Z1152 Encounter for screening for COVID-19: Secondary | ICD-10-CM | POA: Diagnosis not present

## 2020-04-29 DIAGNOSIS — R69 Illness, unspecified: Secondary | ICD-10-CM | POA: Diagnosis not present

## 2020-04-29 DIAGNOSIS — F410 Panic disorder [episodic paroxysmal anxiety] without agoraphobia: Secondary | ICD-10-CM | POA: Diagnosis not present

## 2020-04-29 DIAGNOSIS — F4312 Post-traumatic stress disorder, chronic: Secondary | ICD-10-CM | POA: Diagnosis not present

## 2020-05-05 DIAGNOSIS — R1310 Dysphagia, unspecified: Secondary | ICD-10-CM | POA: Diagnosis not present

## 2020-05-05 DIAGNOSIS — K5900 Constipation, unspecified: Secondary | ICD-10-CM | POA: Diagnosis not present

## 2020-05-05 DIAGNOSIS — K219 Gastro-esophageal reflux disease without esophagitis: Secondary | ICD-10-CM | POA: Diagnosis not present

## 2020-05-05 DIAGNOSIS — Z1159 Encounter for screening for other viral diseases: Secondary | ICD-10-CM | POA: Diagnosis not present

## 2020-05-05 DIAGNOSIS — K649 Unspecified hemorrhoids: Secondary | ICD-10-CM | POA: Diagnosis not present

## 2020-06-07 DIAGNOSIS — U071 COVID-19: Secondary | ICD-10-CM | POA: Diagnosis not present

## 2020-06-07 DIAGNOSIS — Z20822 Contact with and (suspected) exposure to covid-19: Secondary | ICD-10-CM | POA: Diagnosis not present

## 2020-06-10 DIAGNOSIS — R059 Cough, unspecified: Secondary | ICD-10-CM | POA: Diagnosis not present

## 2020-06-10 DIAGNOSIS — U071 COVID-19: Secondary | ICD-10-CM | POA: Diagnosis not present

## 2020-06-10 DIAGNOSIS — Z8616 Personal history of COVID-19: Secondary | ICD-10-CM | POA: Diagnosis not present

## 2020-07-12 DIAGNOSIS — R000 Tachycardia, unspecified: Secondary | ICD-10-CM | POA: Diagnosis not present

## 2020-07-12 DIAGNOSIS — R69 Illness, unspecified: Secondary | ICD-10-CM | POA: Diagnosis not present

## 2020-07-12 DIAGNOSIS — F319 Bipolar disorder, unspecified: Secondary | ICD-10-CM | POA: Diagnosis not present

## 2020-07-12 DIAGNOSIS — R0600 Dyspnea, unspecified: Secondary | ICD-10-CM | POA: Diagnosis not present

## 2020-07-14 DIAGNOSIS — R69 Illness, unspecified: Secondary | ICD-10-CM | POA: Diagnosis not present

## 2020-07-14 DIAGNOSIS — R635 Abnormal weight gain: Secondary | ICD-10-CM | POA: Diagnosis not present

## 2020-07-14 DIAGNOSIS — R0600 Dyspnea, unspecified: Secondary | ICD-10-CM | POA: Diagnosis not present

## 2020-07-14 DIAGNOSIS — G4710 Hypersomnia, unspecified: Secondary | ICD-10-CM | POA: Diagnosis not present

## 2020-07-14 DIAGNOSIS — R000 Tachycardia, unspecified: Secondary | ICD-10-CM | POA: Diagnosis not present

## 2020-07-14 DIAGNOSIS — I10 Essential (primary) hypertension: Secondary | ICD-10-CM | POA: Diagnosis not present

## 2020-07-28 DIAGNOSIS — F4312 Post-traumatic stress disorder, chronic: Secondary | ICD-10-CM | POA: Diagnosis not present

## 2020-07-28 DIAGNOSIS — F410 Panic disorder [episodic paroxysmal anxiety] without agoraphobia: Secondary | ICD-10-CM | POA: Diagnosis not present

## 2020-07-28 DIAGNOSIS — R69 Illness, unspecified: Secondary | ICD-10-CM | POA: Diagnosis not present

## 2020-08-13 DIAGNOSIS — D128 Benign neoplasm of rectum: Secondary | ICD-10-CM | POA: Diagnosis not present

## 2020-08-13 DIAGNOSIS — K449 Diaphragmatic hernia without obstruction or gangrene: Secondary | ICD-10-CM | POA: Diagnosis not present

## 2020-08-13 DIAGNOSIS — K219 Gastro-esophageal reflux disease without esophagitis: Secondary | ICD-10-CM | POA: Diagnosis not present

## 2020-08-13 DIAGNOSIS — R1310 Dysphagia, unspecified: Secondary | ICD-10-CM | POA: Diagnosis not present

## 2020-08-13 DIAGNOSIS — K621 Rectal polyp: Secondary | ICD-10-CM | POA: Diagnosis not present

## 2020-08-13 DIAGNOSIS — K5900 Constipation, unspecified: Secondary | ICD-10-CM | POA: Diagnosis not present

## 2020-08-17 DIAGNOSIS — K621 Rectal polyp: Secondary | ICD-10-CM | POA: Diagnosis not present

## 2020-08-17 DIAGNOSIS — D128 Benign neoplasm of rectum: Secondary | ICD-10-CM | POA: Diagnosis not present

## 2020-08-27 DIAGNOSIS — F410 Panic disorder [episodic paroxysmal anxiety] without agoraphobia: Secondary | ICD-10-CM | POA: Diagnosis not present

## 2020-08-27 DIAGNOSIS — G4719 Other hypersomnia: Secondary | ICD-10-CM | POA: Diagnosis not present

## 2020-08-27 DIAGNOSIS — F4312 Post-traumatic stress disorder, chronic: Secondary | ICD-10-CM | POA: Diagnosis not present

## 2020-08-27 DIAGNOSIS — R0683 Snoring: Secondary | ICD-10-CM | POA: Diagnosis not present

## 2020-08-27 DIAGNOSIS — R69 Illness, unspecified: Secondary | ICD-10-CM | POA: Diagnosis not present

## 2020-08-27 DIAGNOSIS — I10 Essential (primary) hypertension: Secondary | ICD-10-CM | POA: Diagnosis not present

## 2020-09-01 ENCOUNTER — Other Ambulatory Visit: Payer: Self-pay | Admitting: Physician Assistant

## 2020-09-01 DIAGNOSIS — F410 Panic disorder [episodic paroxysmal anxiety] without agoraphobia: Secondary | ICD-10-CM | POA: Diagnosis not present

## 2020-09-01 DIAGNOSIS — R69 Illness, unspecified: Secondary | ICD-10-CM | POA: Diagnosis not present

## 2020-09-01 DIAGNOSIS — K219 Gastro-esophageal reflux disease without esophagitis: Secondary | ICD-10-CM | POA: Diagnosis not present

## 2020-09-01 DIAGNOSIS — F4312 Post-traumatic stress disorder, chronic: Secondary | ICD-10-CM | POA: Diagnosis not present

## 2020-09-01 DIAGNOSIS — R198 Other specified symptoms and signs involving the digestive system and abdomen: Secondary | ICD-10-CM | POA: Diagnosis not present

## 2020-09-07 DIAGNOSIS — F410 Panic disorder [episodic paroxysmal anxiety] without agoraphobia: Secondary | ICD-10-CM | POA: Diagnosis not present

## 2020-09-07 DIAGNOSIS — R69 Illness, unspecified: Secondary | ICD-10-CM | POA: Diagnosis not present

## 2020-09-07 DIAGNOSIS — F419 Anxiety disorder, unspecified: Secondary | ICD-10-CM | POA: Diagnosis not present

## 2020-09-07 DIAGNOSIS — F4312 Post-traumatic stress disorder, chronic: Secondary | ICD-10-CM | POA: Diagnosis not present

## 2020-09-10 DIAGNOSIS — R69 Illness, unspecified: Secondary | ICD-10-CM | POA: Diagnosis not present

## 2020-09-10 DIAGNOSIS — R0602 Shortness of breath: Secondary | ICD-10-CM | POA: Diagnosis not present

## 2020-09-10 DIAGNOSIS — G43909 Migraine, unspecified, not intractable, without status migrainosus: Secondary | ICD-10-CM | POA: Diagnosis not present

## 2020-09-10 DIAGNOSIS — Z716 Tobacco abuse counseling: Secondary | ICD-10-CM | POA: Diagnosis not present

## 2020-09-10 DIAGNOSIS — R059 Cough, unspecified: Secondary | ICD-10-CM | POA: Diagnosis not present

## 2020-09-10 DIAGNOSIS — R062 Wheezing: Secondary | ICD-10-CM | POA: Diagnosis not present

## 2020-09-10 DIAGNOSIS — K219 Gastro-esophageal reflux disease without esophagitis: Secondary | ICD-10-CM | POA: Diagnosis not present

## 2020-09-10 DIAGNOSIS — R002 Palpitations: Secondary | ICD-10-CM | POA: Diagnosis not present

## 2020-09-10 DIAGNOSIS — R0789 Other chest pain: Secondary | ICD-10-CM | POA: Diagnosis not present

## 2020-09-10 DIAGNOSIS — I10 Essential (primary) hypertension: Secondary | ICD-10-CM | POA: Diagnosis not present

## 2020-09-20 ENCOUNTER — Other Ambulatory Visit: Payer: No Typology Code available for payment source

## 2020-09-28 DIAGNOSIS — F410 Panic disorder [episodic paroxysmal anxiety] without agoraphobia: Secondary | ICD-10-CM | POA: Diagnosis not present

## 2020-09-28 DIAGNOSIS — R69 Illness, unspecified: Secondary | ICD-10-CM | POA: Diagnosis not present

## 2020-09-28 DIAGNOSIS — F4312 Post-traumatic stress disorder, chronic: Secondary | ICD-10-CM | POA: Diagnosis not present

## 2020-09-28 DIAGNOSIS — F419 Anxiety disorder, unspecified: Secondary | ICD-10-CM | POA: Diagnosis not present

## 2020-10-11 DIAGNOSIS — R062 Wheezing: Secondary | ICD-10-CM | POA: Diagnosis not present

## 2020-10-11 DIAGNOSIS — Z716 Tobacco abuse counseling: Secondary | ICD-10-CM | POA: Diagnosis not present

## 2020-10-11 DIAGNOSIS — K219 Gastro-esophageal reflux disease without esophagitis: Secondary | ICD-10-CM | POA: Diagnosis not present

## 2020-10-11 DIAGNOSIS — G43909 Migraine, unspecified, not intractable, without status migrainosus: Secondary | ICD-10-CM | POA: Diagnosis not present

## 2020-10-11 DIAGNOSIS — R0789 Other chest pain: Secondary | ICD-10-CM | POA: Diagnosis not present

## 2020-10-11 DIAGNOSIS — R0602 Shortness of breath: Secondary | ICD-10-CM | POA: Diagnosis not present

## 2020-10-11 DIAGNOSIS — R002 Palpitations: Secondary | ICD-10-CM | POA: Diagnosis not present

## 2020-10-11 DIAGNOSIS — I10 Essential (primary) hypertension: Secondary | ICD-10-CM | POA: Diagnosis not present

## 2020-10-11 DIAGNOSIS — R69 Illness, unspecified: Secondary | ICD-10-CM | POA: Diagnosis not present

## 2020-10-11 DIAGNOSIS — R059 Cough, unspecified: Secondary | ICD-10-CM | POA: Diagnosis not present

## 2020-10-29 ENCOUNTER — Encounter (HOSPITAL_COMMUNITY): Payer: Self-pay | Admitting: Emergency Medicine

## 2020-10-29 ENCOUNTER — Emergency Department (HOSPITAL_COMMUNITY): Payer: 59

## 2020-10-29 ENCOUNTER — Other Ambulatory Visit: Payer: Self-pay

## 2020-10-29 ENCOUNTER — Emergency Department (HOSPITAL_COMMUNITY)
Admission: EM | Admit: 2020-10-29 | Discharge: 2020-10-29 | Disposition: A | Discharge status: Home / Self Care | Payer: 59 | Attending: Emergency Medicine | Admitting: Emergency Medicine

## 2020-10-29 DIAGNOSIS — R0781 Pleurodynia: Secondary | ICD-10-CM | POA: Insufficient documentation

## 2020-10-29 DIAGNOSIS — R7989 Other specified abnormal findings of blood chemistry: Secondary | ICD-10-CM | POA: Diagnosis not present

## 2020-10-29 DIAGNOSIS — J45909 Unspecified asthma, uncomplicated: Secondary | ICD-10-CM | POA: Insufficient documentation

## 2020-10-29 DIAGNOSIS — R69 Illness, unspecified: Secondary | ICD-10-CM | POA: Diagnosis not present

## 2020-10-29 DIAGNOSIS — Z91040 Latex allergy status: Secondary | ICD-10-CM | POA: Diagnosis not present

## 2020-10-29 DIAGNOSIS — Z79899 Other long term (current) drug therapy: Secondary | ICD-10-CM | POA: Insufficient documentation

## 2020-10-29 DIAGNOSIS — R0602 Shortness of breath: Secondary | ICD-10-CM | POA: Diagnosis not present

## 2020-10-29 DIAGNOSIS — R002 Palpitations: Secondary | ICD-10-CM | POA: Diagnosis not present

## 2020-10-29 DIAGNOSIS — R0789 Other chest pain: Secondary | ICD-10-CM | POA: Diagnosis not present

## 2020-10-29 DIAGNOSIS — F17210 Nicotine dependence, cigarettes, uncomplicated: Secondary | ICD-10-CM | POA: Insufficient documentation

## 2020-10-29 DIAGNOSIS — R5383 Other fatigue: Secondary | ICD-10-CM | POA: Insufficient documentation

## 2020-10-29 DIAGNOSIS — R42 Dizziness and giddiness: Secondary | ICD-10-CM | POA: Diagnosis not present

## 2020-10-29 DIAGNOSIS — I10 Essential (primary) hypertension: Secondary | ICD-10-CM | POA: Diagnosis not present

## 2020-10-29 DIAGNOSIS — R55 Syncope and collapse: Secondary | ICD-10-CM | POA: Insufficient documentation

## 2020-10-29 DIAGNOSIS — R079 Chest pain, unspecified: Secondary | ICD-10-CM | POA: Diagnosis not present

## 2020-10-29 HISTORY — DX: Essential (primary) hypertension: I10

## 2020-10-29 HISTORY — DX: Unspecified asthma, uncomplicated: J45.909

## 2020-10-29 LAB — URINALYSIS, ROUTINE W REFLEX MICROSCOPIC
Bilirubin Urine: NEGATIVE
Glucose, UA: NEGATIVE mg/dL
Hgb urine dipstick: NEGATIVE
Ketones, ur: NEGATIVE mg/dL
Leukocytes,Ua: NEGATIVE
Nitrite: NEGATIVE
Protein, ur: NEGATIVE mg/dL
Specific Gravity, Urine: 1.004 — ABNORMAL LOW (ref 1.005–1.030)
pH: 7 (ref 5.0–8.0)

## 2020-10-29 LAB — CBC
HCT: 34.1 % — ABNORMAL LOW (ref 36.0–46.0)
Hemoglobin: 10.4 g/dL — ABNORMAL LOW (ref 12.0–15.0)
MCH: 23.5 pg — ABNORMAL LOW (ref 26.0–34.0)
MCHC: 30.5 g/dL (ref 30.0–36.0)
MCV: 77.1 fL — ABNORMAL LOW (ref 80.0–100.0)
Platelets: 412 10*3/uL — ABNORMAL HIGH (ref 150–400)
RBC: 4.42 MIL/uL (ref 3.87–5.11)
RDW: 16.9 % — ABNORMAL HIGH (ref 11.5–15.5)
WBC: 8.6 10*3/uL (ref 4.0–10.5)
nRBC: 0 % (ref 0.0–0.2)

## 2020-10-29 LAB — BASIC METABOLIC PANEL
Anion gap: 10 (ref 5–15)
BUN: 8 mg/dL (ref 6–20)
CO2: 21 mmol/L — ABNORMAL LOW (ref 22–32)
Calcium: 9.1 mg/dL (ref 8.9–10.3)
Chloride: 107 mmol/L (ref 98–111)
Creatinine, Ser: 0.63 mg/dL (ref 0.44–1.00)
GFR, Estimated: >60 mL/min (ref >60)
Glucose, Bld: 102 mg/dL — ABNORMAL HIGH (ref 70–99)
Potassium: 3.9 mmol/L (ref 3.5–5.1)
Sodium: 138 mmol/L (ref 135–145)

## 2020-10-29 LAB — HEPATIC FUNCTION PANEL
ALT: 10 U/L (ref 0–44)
AST: 19 U/L (ref 15–41)
Albumin: 3.5 g/dL (ref 3.5–5.0)
Alkaline Phosphatase: 122 U/L (ref 38–126)
Bilirubin, Direct: <0.1 mg/dL (ref 0.0–0.2)
Total Bilirubin: 0.4 mg/dL (ref 0.3–1.2)
Total Protein: 6.9 g/dL (ref 6.5–8.1)

## 2020-10-29 LAB — LIPASE, BLOOD: Lipase: 34 U/L (ref 11–51)

## 2020-10-29 LAB — MAGNESIUM: Magnesium: 1.9 mg/dL (ref 1.7–2.4)

## 2020-10-29 LAB — TROPONIN I (HIGH SENSITIVITY)
Troponin I (High Sensitivity): 5 ng/L (ref <18)
Troponin I (High Sensitivity): 3 ng/L (ref <18)

## 2020-10-29 LAB — I-STAT BETA HCG BLOOD, ED (MC, WL, AP ONLY): I-stat hCG, quantitative: <5 m[IU]/mL (ref <5)

## 2020-10-29 LAB — TSH: TSH: 1.491 u[IU]/mL (ref 0.350–4.500)

## 2020-10-29 LAB — D-DIMER, QUANTITATIVE: D-Dimer, Quant: 0.5 ug/mL-FEU (ref 0.00–0.50)

## 2020-10-29 MED ORDER — IOHEXOL 350 MG/ML SOLN
75.0000 mL | Freq: Once | INTRAVENOUS | Status: AC | PRN
  Administered 2020-10-29: 75 mL via INTRAVENOUS

## 2020-10-29 MED ORDER — SODIUM CHLORIDE 0.9 % IV BOLUS
1000.0000 mL | Freq: Once | INTRAVENOUS | Status: AC
  Administered 2020-10-29: 1000 mL via INTRAVENOUS

## 2020-10-29 NOTE — ED Provider Notes (Signed)
[facility] EMERGENCY DEPARTMENT Provider Note   CSN: [id number] Arrival date & time: 10/29/20  8937     History Chief Complaint  Patient presents with   Palpitations   Chest Pain    Melissa Walter is a 45 y.o. female.  The history is provided by the patient and medical records. No language interpreter was used.  Palpitations Palpitations quality:  Irregular Onset quality:  Sudden Duration:  2 days Timing:  Intermittent Progression:  Waxing and waning Chronicity:  Recurrent Context: anxiety and dehydration   Relieved by:  Nothing Worsened by:  Nothing Ineffective treatments:  None tried Associated symptoms: chest pain, malaise/fatigue, near-syncope and shortness of breath   Associated symptoms: no back pain, no cough, no diaphoresis, no dizziness, no leg pain, no nausea, no vomiting and no weakness   Chest Pain Associated symptoms: fatigue, near-syncope, palpitations and shortness of breath   Associated symptoms: no abdominal pain, no back pain, no cough, no diaphoresis, no dizziness, no fever, no nausea, no vomiting and no weakness       Past Medical History:  Diagnosis Date   Anxiety    Arthritis    Asthma    Bursitis    Depression    Fibromyalgia    Frequent headaches    Hypertension    Panic attacks    Scoliosis     Patient Active Problem List   Diagnosis Date Noted   Anxiety 03/16/2015   Arthritis 03/16/2015   Clinical depression 03/16/2015   Fibromyalgia 03/16/2015   Migraine with aura and without status migrainosus, not intractable 03/16/2015   Current tobacco use 01/29/2014    Past Surgical History:  Procedure Laterality Date   CESAREAN SECTION     x 3   MULTIPLE TOOTH EXTRACTIONS     6 teeth   tubal ligation       OB History   No obstetric history on file.     Family History  Problem Relation Age of Onset   Fibromyalgia Mother    Arthritis Mother    Cancer - Colon Maternal Aunt    Stroke Paternal Aunt     Cancer Paternal Aunt        breast   Arthritis Maternal Grandmother    Diabetes Maternal Grandmother    Alzheimer's disease Maternal Grandmother    Fibromyalgia Paternal Grandmother    Arthritis Paternal Grandmother     Social History   Tobacco Use   Smoking status: Every Day    Packs/day: 1.00    Years: 21.00    Pack years: 21.00    Types: Cigarettes   Smokeless tobacco: Never  Vaping Use   Vaping Use: Some days  Substance Use Topics   Alcohol use: Yes    Comment: rare   Drug use: No    Home Medications Prior to Admission medications   Medication Sig Start Date End Date Taking? Authorizing Provider  clonazePAM (KLONOPIN) 0.5 MG tablet Take 1 tablet (0.5 mg total) by mouth 2 (two) times daily as needed for anxiety. 06/18/16 06/25/16  Isa Rankin, MD  diclofenac (VOLTAREN) 75 MG EC tablet Take 1 tablet (75 mg total) by mouth 2 (two) times daily. 08/30/17   Elvina Sidle, MD  DULoxetine (CYMBALTA) 60 MG capsule Take 1 capsule (60 mg total) by mouth daily. 06/18/16   Isa Rankin, MD  levocetirizine (XYZAL) 5 MG tablet levocetirizine 5 mg tablet    [provider]  rizatriptan (MAXALT) 10 MG tablet Take 1  tablet (10 mg total) by mouth as needed for migraine. 06/18/16   Isa Rankin, MD  tiZANidine (ZANAFLEX) 4 MG tablet TAKE 1 TABLET BY MOUTH AT BEDTIME 03/15/17   Pollyann Savoy, MD  triamterene-hydrochlorothiazide (MAXZIDE-25) 37.5-25 MG tablet Take 1 tablet by mouth daily. 04/27/19   Frederica Kuster, MD    Allergies    Amoxicillin and Latex  Review of Systems   Review of Systems  Constitutional:  Positive for fatigue and malaise/fatigue. Negative for chills, diaphoresis and fever.  HENT:  Negative for congestion.   Eyes:  Negative for visual disturbance.  Respiratory:  Positive for chest tightness and shortness of breath. Negative for cough and wheezing.   Cardiovascular:  Positive for chest pain, palpitations and near-syncope.   Gastrointestinal:  Negative for abdominal pain, constipation, diarrhea, nausea and vomiting.  Genitourinary:  Negative for dysuria and flank pain.  Musculoskeletal:  Negative for back pain, neck pain and neck stiffness.  Skin:  Negative for rash and wound.  Neurological:  Positive for light-headedness. Negative for dizziness, syncope and weakness.  Psychiatric/Behavioral:  Negative for agitation and confusion.   All other systems reviewed and are negative.  Physical Exam Updated Vital Signs BP 138/77   Pulse 77   Temp 99.8 F (37.7 C) (Oral)   Resp 20   Ht 5\' 2"  (1.575 m)   Wt 84.4 kg   LMP 09/29/2020 (Approximate)   SpO2 100%   BMI 34.02 kg/m   Physical Exam Vitals and nursing note reviewed.  Constitutional:      General: She is not in acute distress.    Appearance: She is well-developed. She is not ill-appearing, toxic-appearing or diaphoretic.  HENT:     Head: Normocephalic and atraumatic.  Eyes:     Conjunctiva/sclera: Conjunctivae normal.  Cardiovascular:     Rate and Rhythm: Normal rate and regular rhythm.     Pulses: Normal pulses.     Heart sounds: No murmur heard. Pulmonary:     Effort: Pulmonary effort is normal. No respiratory distress.     Breath sounds: Normal breath sounds. No wheezing, rhonchi or rales.  Chest:     Chest wall: Tenderness present.  Abdominal:     General: Abdomen is flat.     Palpations: Abdomen is soft.     Tenderness: There is no abdominal tenderness. There is no guarding or rebound.  Musculoskeletal:        General: No tenderness.     Cervical back: Neck supple. No tenderness.     Right lower leg: No edema.     Left lower leg: No edema.  Skin:    General: Skin is warm and dry.  Neurological:     General: No focal deficit present.     Mental Status: She is alert.  Psychiatric:        Mood and Affect: Mood is anxious.    ED Results / Procedures / Treatments   Labs (all labs ordered are listed, but only abnormal results are  displayed) Labs Reviewed  BASIC METABOLIC PANEL - Abnormal; Notable for the following components:      Result Value   CO2 21 (*)    Glucose, Bld 102 (*)    All other components within normal limits  CBC - Abnormal; Notable for the following components:   Hemoglobin 10.4 (*)    HCT 34.1 (*)    MCV 77.1 (*)    MCH 23.5 (*)    RDW 16.9 (*)    Platelets  412 (*)    All other components within normal limits  URINALYSIS, ROUTINE W REFLEX MICROSCOPIC - Abnormal; Notable for the following components:   Specific Gravity, Urine 1.004 (*)    Bacteria, UA RARE (*)    All other components within normal limits  TSH  LIPASE, BLOOD  HEPATIC FUNCTION PANEL  D-DIMER, QUANTITATIVE  MAGNESIUM  I-STAT BETA HCG BLOOD, ED ([facility], [facility], [facility] ONLY)  TROPONIN I (HIGH SENSITIVITY)  TROPONIN I (HIGH SENSITIVITY)    EKG EKG Interpretation  Date/Time:  Friday October 29 2020 09:52:31 EDT Ventricular Rate:  79 PR Interval:  138 QRS Duration: 92 QT Interval:  418 QTC Calculation: 479 R Axis:   17 Text Interpretation: Normal sinus rhythm T wave abnormality, consider anterior ischemia Abnormal ECG When compared to prior, similar appearance. No STEMI Confirmed by Theda Belfastegeler, Chris ([id number]) on 10/29/2020 11:01:11 AM  Radiology DG Chest 2 View  Result Date: 10/29/2020 CLINICAL DATA:  Left-sided chest pain EXAM: CHEST - 2 VIEW COMPARISON:  None. FINDINGS: The heart size and mediastinal contours are within normal limits. Both lungs are clear. The visualized skeletal structures are unremarkable. IMPRESSION: No active cardiopulmonary disease. Electronically Signed   By: Duanne GuessNicholas  Plundo D.O.   On: 10/29/2020 10:50    Procedures Procedures   Medications Ordered in ED Medications  sodium chloride 0.9 % bolus 1,000 mL (0 mLs Intravenous Stopped 10/29/20 1350)  iohexol (OMNIPAQUE) 350 MG/ML injection 75 mL (75 mLs Intravenous Contrast Given 10/29/20 1216)    ED Course  I have reviewed the triage vital signs and the nursing  notes.  Pertinent labs & imaging results that were available during my care of the patient were reviewed by me and considered in my medical decision making (see chart for details).    MDM Rules/Calculators/A&P                          Waldon Reiningina I Haik is a 45 y.o. female with a past medical history significant for fibromyalgia, anxiety, depression, migraines, hypertension, and asthma who presents for intermittent palpitations, chest pain, shortness of breath, and lightheadedness.  Patient reports that she is currently missed a work-up with her cardiology team and she is reports she is being referred to electrophysiology because they are concerned about rhythm troubles that she may be having by her report.  She says that yesterday, she started having chest discomfort under her left breast that is very exertional and pleuritic.  Is up to a 9 out of 10 in severity.  She reports it is very sharp and she feels that she cannot breathe.  She denies fevers, chills, or cough.  She does report feeling lightheaded like she was going to pass out at times specially when she stood up.  She reports no real nausea, vomiting, constipation, or diarrhea.  She reports her legs are always slightly swollen but denies any unilateral or bilateral leg pain.  She reports that the shortness of breath and pain are worse than they have been in the past.  On exam, lungs are clear left chest is tender to palpation.  No murmur.  Good pulses in extremities.  Lower extremities were not significant tenderness on my evaluation.  Abdomen nontender.  Normal bowel sounds.  EKG does not show STEMI.  Given patient's pleuritic and exertional chest discomfort, will get work-up to look for a cardiac or pulmonary cause of symptoms.  We will get a D-dimer given the severe sharp and pleuritic nature  of her discomfort and her tachypnea on my evaluation.  We will get delta troponin as well as other labs.  We will keep her monitored on telemetry while we  start her work-up.  We will give her some fluids for what I suspect is a mild dehydration with this lightheadedness with standing and soft pressures she reports at home.  If work-up is reassuring, I suspect she will likely stable for discharge and continued outpatient management with her cardiology and upcoming electrophysiology teams.   Work-up overall reassuring.  She is feeling better.  Patient was discharged home to continue outpatient work-up with her cardiology team.  She noted questions or concerns and was discharged in good condition   Final Clinical Impression(s) / ED Diagnoses Final diagnoses:  Palpitations  Atypical chest pain     Clinical Impression: 1. Palpitations   2. Atypical chest pain     Disposition: Discharge  Condition: Good  I have discussed the results, Dx and Tx plan with the pt(& family if present). He/she/they expressed understanding and agree(s) with the plan. Discharge instructions discussed at great length. Strict return precautions discussed and pt &/or family have verbalized understanding of the instructions. No further questions at time of discharge.    Discharge Medication List as of 10/29/2020  3:09 PM      Follow Up: Maretta Bees, PA [address] [place] [address] [phone]     [facility] CARDIOVASCULAR DIVISION [address] [place] [address] [phone]       Natasha Burda, Canary Brim, MD 10/29/20 [phone]

## 2020-10-29 NOTE — ED Notes (Signed)
Vital signs stable. 

## 2020-10-29 NOTE — ED Notes (Signed)
Patient transported to CT 

## 2020-10-29 NOTE — ED Triage Notes (Signed)
Pt reports left sided chest pain without radiation that started yesterday afternoon. Pt reports waking up today with palpitations and pain radiating to back. Pr reports 3/10 cp at this time. Pt also reports dizziness and sob. Denies n/v. Pt reports negative covid test yesterday. Hx of htn and anxiety. Pt reports she took 0.5 mg Klonopin before arrival with no relief.

## 2020-10-29 NOTE — Discharge Instructions (Addendum)
Your work-up today was overall reassuring.  Please follow-up with your cardiology team and your primary team and please rest and stay hydrated.  If any symptoms change or worsen, please return to the nearest emergency department.

## 2020-11-02 DIAGNOSIS — F3162 Bipolar disorder, current episode mixed, moderate: Secondary | ICD-10-CM | POA: Diagnosis not present

## 2020-11-02 DIAGNOSIS — F419 Anxiety disorder, unspecified: Secondary | ICD-10-CM | POA: Diagnosis not present

## 2020-11-02 DIAGNOSIS — F410 Panic disorder [episodic paroxysmal anxiety] without agoraphobia: Secondary | ICD-10-CM | POA: Diagnosis not present

## 2020-11-02 DIAGNOSIS — R69 Illness, unspecified: Secondary | ICD-10-CM | POA: Diagnosis not present

## 2020-11-10 DIAGNOSIS — R0789 Other chest pain: Secondary | ICD-10-CM | POA: Diagnosis not present

## 2020-11-10 DIAGNOSIS — R0602 Shortness of breath: Secondary | ICD-10-CM | POA: Diagnosis not present

## 2020-11-10 DIAGNOSIS — R002 Palpitations: Secondary | ICD-10-CM | POA: Diagnosis not present

## 2020-11-10 DIAGNOSIS — K219 Gastro-esophageal reflux disease without esophagitis: Secondary | ICD-10-CM | POA: Diagnosis not present

## 2020-11-10 DIAGNOSIS — G43909 Migraine, unspecified, not intractable, without status migrainosus: Secondary | ICD-10-CM | POA: Diagnosis not present

## 2020-11-10 DIAGNOSIS — R69 Illness, unspecified: Secondary | ICD-10-CM | POA: Diagnosis not present

## 2020-11-10 DIAGNOSIS — R059 Cough, unspecified: Secondary | ICD-10-CM | POA: Diagnosis not present

## 2020-11-10 DIAGNOSIS — I10 Essential (primary) hypertension: Secondary | ICD-10-CM | POA: Diagnosis not present

## 2020-11-10 DIAGNOSIS — R062 Wheezing: Secondary | ICD-10-CM | POA: Diagnosis not present

## 2020-11-10 DIAGNOSIS — Z716 Tobacco abuse counseling: Secondary | ICD-10-CM | POA: Diagnosis not present

## 2020-12-08 DIAGNOSIS — F4312 Post-traumatic stress disorder, chronic: Secondary | ICD-10-CM | POA: Diagnosis not present

## 2020-12-08 DIAGNOSIS — F419 Anxiety disorder, unspecified: Secondary | ICD-10-CM | POA: Diagnosis not present

## 2020-12-08 DIAGNOSIS — R69 Illness, unspecified: Secondary | ICD-10-CM | POA: Diagnosis not present

## 2020-12-08 DIAGNOSIS — F410 Panic disorder [episodic paroxysmal anxiety] without agoraphobia: Secondary | ICD-10-CM | POA: Diagnosis not present

## 2020-12-11 DIAGNOSIS — I10 Essential (primary) hypertension: Secondary | ICD-10-CM | POA: Diagnosis not present

## 2020-12-11 DIAGNOSIS — K219 Gastro-esophageal reflux disease without esophagitis: Secondary | ICD-10-CM | POA: Diagnosis not present

## 2020-12-11 DIAGNOSIS — R69 Illness, unspecified: Secondary | ICD-10-CM | POA: Diagnosis not present

## 2020-12-11 DIAGNOSIS — R059 Cough, unspecified: Secondary | ICD-10-CM | POA: Diagnosis not present

## 2020-12-11 DIAGNOSIS — G43909 Migraine, unspecified, not intractable, without status migrainosus: Secondary | ICD-10-CM | POA: Diagnosis not present

## 2020-12-11 DIAGNOSIS — R002 Palpitations: Secondary | ICD-10-CM | POA: Diagnosis not present

## 2020-12-11 DIAGNOSIS — R0789 Other chest pain: Secondary | ICD-10-CM | POA: Diagnosis not present

## 2020-12-11 DIAGNOSIS — Z716 Tobacco abuse counseling: Secondary | ICD-10-CM | POA: Diagnosis not present

## 2020-12-11 DIAGNOSIS — R0602 Shortness of breath: Secondary | ICD-10-CM | POA: Diagnosis not present

## 2020-12-11 DIAGNOSIS — R062 Wheezing: Secondary | ICD-10-CM | POA: Diagnosis not present

## 2021-01-11 DIAGNOSIS — R002 Palpitations: Secondary | ICD-10-CM | POA: Diagnosis not present

## 2021-01-11 DIAGNOSIS — K219 Gastro-esophageal reflux disease without esophagitis: Secondary | ICD-10-CM | POA: Diagnosis not present

## 2021-01-11 DIAGNOSIS — R69 Illness, unspecified: Secondary | ICD-10-CM | POA: Diagnosis not present

## 2021-01-11 DIAGNOSIS — I10 Essential (primary) hypertension: Secondary | ICD-10-CM | POA: Diagnosis not present

## 2021-01-11 DIAGNOSIS — Z716 Tobacco abuse counseling: Secondary | ICD-10-CM | POA: Diagnosis not present

## 2021-01-11 DIAGNOSIS — R0602 Shortness of breath: Secondary | ICD-10-CM | POA: Diagnosis not present

## 2021-01-11 DIAGNOSIS — R059 Cough, unspecified: Secondary | ICD-10-CM | POA: Diagnosis not present

## 2021-01-11 DIAGNOSIS — R0789 Other chest pain: Secondary | ICD-10-CM | POA: Diagnosis not present

## 2021-01-11 DIAGNOSIS — G43909 Migraine, unspecified, not intractable, without status migrainosus: Secondary | ICD-10-CM | POA: Diagnosis not present

## 2021-01-11 DIAGNOSIS — R062 Wheezing: Secondary | ICD-10-CM | POA: Diagnosis not present

## 2021-01-17 DIAGNOSIS — R0602 Shortness of breath: Secondary | ICD-10-CM | POA: Diagnosis not present

## 2021-01-17 DIAGNOSIS — I10 Essential (primary) hypertension: Secondary | ICD-10-CM | POA: Diagnosis not present

## 2021-01-17 DIAGNOSIS — G43909 Migraine, unspecified, not intractable, without status migrainosus: Secondary | ICD-10-CM | POA: Diagnosis not present

## 2021-01-17 DIAGNOSIS — K219 Gastro-esophageal reflux disease without esophagitis: Secondary | ICD-10-CM | POA: Diagnosis not present

## 2021-01-17 DIAGNOSIS — Z716 Tobacco abuse counseling: Secondary | ICD-10-CM | POA: Diagnosis not present

## 2021-01-17 DIAGNOSIS — R0789 Other chest pain: Secondary | ICD-10-CM | POA: Diagnosis not present

## 2021-01-17 DIAGNOSIS — R002 Palpitations: Secondary | ICD-10-CM | POA: Diagnosis not present

## 2021-01-17 DIAGNOSIS — R69 Illness, unspecified: Secondary | ICD-10-CM | POA: Diagnosis not present

## 2021-01-17 DIAGNOSIS — R059 Cough, unspecified: Secondary | ICD-10-CM | POA: Diagnosis not present

## 2021-01-17 DIAGNOSIS — R062 Wheezing: Secondary | ICD-10-CM | POA: Diagnosis not present

## 2021-01-19 DIAGNOSIS — F410 Panic disorder [episodic paroxysmal anxiety] without agoraphobia: Secondary | ICD-10-CM | POA: Diagnosis not present

## 2021-01-19 DIAGNOSIS — R69 Illness, unspecified: Secondary | ICD-10-CM | POA: Diagnosis not present

## 2021-01-19 DIAGNOSIS — F4312 Post-traumatic stress disorder, chronic: Secondary | ICD-10-CM | POA: Diagnosis not present

## 2021-01-19 DIAGNOSIS — F419 Anxiety disorder, unspecified: Secondary | ICD-10-CM | POA: Diagnosis not present

## 2021-02-10 DIAGNOSIS — I10 Essential (primary) hypertension: Secondary | ICD-10-CM | POA: Diagnosis not present

## 2021-02-10 DIAGNOSIS — R0602 Shortness of breath: Secondary | ICD-10-CM | POA: Diagnosis not present

## 2021-02-10 DIAGNOSIS — G43909 Migraine, unspecified, not intractable, without status migrainosus: Secondary | ICD-10-CM | POA: Diagnosis not present

## 2021-02-10 DIAGNOSIS — R062 Wheezing: Secondary | ICD-10-CM | POA: Diagnosis not present

## 2021-02-10 DIAGNOSIS — K219 Gastro-esophageal reflux disease without esophagitis: Secondary | ICD-10-CM | POA: Diagnosis not present

## 2021-02-10 DIAGNOSIS — R059 Cough, unspecified: Secondary | ICD-10-CM | POA: Diagnosis not present

## 2021-02-10 DIAGNOSIS — R0789 Other chest pain: Secondary | ICD-10-CM | POA: Diagnosis not present

## 2021-02-10 DIAGNOSIS — R69 Illness, unspecified: Secondary | ICD-10-CM | POA: Diagnosis not present

## 2021-02-10 DIAGNOSIS — R002 Palpitations: Secondary | ICD-10-CM | POA: Diagnosis not present

## 2021-02-10 DIAGNOSIS — Z716 Tobacco abuse counseling: Secondary | ICD-10-CM | POA: Diagnosis not present

## 2021-03-13 DIAGNOSIS — Z716 Tobacco abuse counseling: Secondary | ICD-10-CM | POA: Diagnosis not present

## 2021-03-13 DIAGNOSIS — R002 Palpitations: Secondary | ICD-10-CM | POA: Diagnosis not present

## 2021-03-13 DIAGNOSIS — G43909 Migraine, unspecified, not intractable, without status migrainosus: Secondary | ICD-10-CM | POA: Diagnosis not present

## 2021-03-13 DIAGNOSIS — R059 Cough, unspecified: Secondary | ICD-10-CM | POA: Diagnosis not present

## 2021-03-13 DIAGNOSIS — R0602 Shortness of breath: Secondary | ICD-10-CM | POA: Diagnosis not present

## 2021-03-13 DIAGNOSIS — R062 Wheezing: Secondary | ICD-10-CM | POA: Diagnosis not present

## 2021-03-13 DIAGNOSIS — I10 Essential (primary) hypertension: Secondary | ICD-10-CM | POA: Diagnosis not present

## 2021-03-13 DIAGNOSIS — R0789 Other chest pain: Secondary | ICD-10-CM | POA: Diagnosis not present

## 2021-03-13 DIAGNOSIS — R69 Illness, unspecified: Secondary | ICD-10-CM | POA: Diagnosis not present

## 2021-03-13 DIAGNOSIS — K219 Gastro-esophageal reflux disease without esophagitis: Secondary | ICD-10-CM | POA: Diagnosis not present

## 2021-04-12 DIAGNOSIS — K219 Gastro-esophageal reflux disease without esophagitis: Secondary | ICD-10-CM | POA: Diagnosis not present

## 2021-04-12 DIAGNOSIS — R062 Wheezing: Secondary | ICD-10-CM | POA: Diagnosis not present

## 2021-04-12 DIAGNOSIS — R0789 Other chest pain: Secondary | ICD-10-CM | POA: Diagnosis not present

## 2021-04-12 DIAGNOSIS — Z716 Tobacco abuse counseling: Secondary | ICD-10-CM | POA: Diagnosis not present

## 2021-04-12 DIAGNOSIS — G43909 Migraine, unspecified, not intractable, without status migrainosus: Secondary | ICD-10-CM | POA: Diagnosis not present

## 2021-04-12 DIAGNOSIS — R0602 Shortness of breath: Secondary | ICD-10-CM | POA: Diagnosis not present

## 2021-04-12 DIAGNOSIS — R059 Cough, unspecified: Secondary | ICD-10-CM | POA: Diagnosis not present

## 2021-04-12 DIAGNOSIS — R69 Illness, unspecified: Secondary | ICD-10-CM | POA: Diagnosis not present

## 2021-04-12 DIAGNOSIS — R002 Palpitations: Secondary | ICD-10-CM | POA: Diagnosis not present

## 2021-04-12 DIAGNOSIS — I10 Essential (primary) hypertension: Secondary | ICD-10-CM | POA: Diagnosis not present

## 2021-07-19 DIAGNOSIS — R300 Dysuria: Secondary | ICD-10-CM | POA: Diagnosis not present

## 2021-09-05 DIAGNOSIS — F419 Anxiety disorder, unspecified: Secondary | ICD-10-CM | POA: Diagnosis not present

## 2021-09-05 DIAGNOSIS — F3162 Bipolar disorder, current episode mixed, moderate: Secondary | ICD-10-CM | POA: Diagnosis not present

## 2021-09-05 DIAGNOSIS — F410 Panic disorder [episodic paroxysmal anxiety] without agoraphobia: Secondary | ICD-10-CM | POA: Diagnosis not present

## 2021-09-05 DIAGNOSIS — R69 Illness, unspecified: Secondary | ICD-10-CM | POA: Diagnosis not present

## 2021-09-28 ENCOUNTER — Other Ambulatory Visit: Payer: Self-pay | Admitting: Family Medicine

## 2021-09-28 DIAGNOSIS — Z1231 Encounter for screening mammogram for malignant neoplasm of breast: Secondary | ICD-10-CM

## 2021-09-28 DIAGNOSIS — I10 Essential (primary) hypertension: Secondary | ICD-10-CM | POA: Diagnosis not present

## 2021-09-28 DIAGNOSIS — R635 Abnormal weight gain: Secondary | ICD-10-CM | POA: Diagnosis not present

## 2021-10-03 DIAGNOSIS — R69 Illness, unspecified: Secondary | ICD-10-CM | POA: Diagnosis not present

## 2021-10-03 DIAGNOSIS — F3162 Bipolar disorder, current episode mixed, moderate: Secondary | ICD-10-CM | POA: Diagnosis not present

## 2021-10-03 DIAGNOSIS — F419 Anxiety disorder, unspecified: Secondary | ICD-10-CM | POA: Diagnosis not present

## 2021-10-03 DIAGNOSIS — F410 Panic disorder [episodic paroxysmal anxiety] without agoraphobia: Secondary | ICD-10-CM | POA: Diagnosis not present

## 2022-10-24 IMAGING — MG DIGITAL SCREENING BILAT W/ TOMO W/ CAD
8 series · 8 of 24 positions shown · non-contrast
Comparison: None.

CLINICAL DATA: Screening.

EXAM:
DIGITAL SCREENING BILATERAL MAMMOGRAM WITH TOMO AND CAD

[R MLO synth-2D]
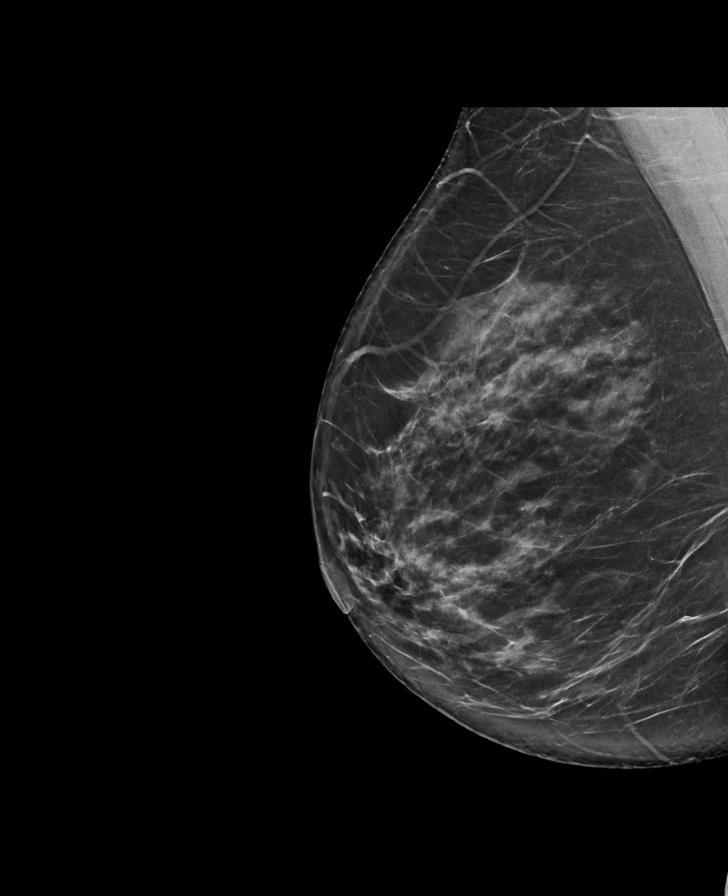

[L CC synth-2D]
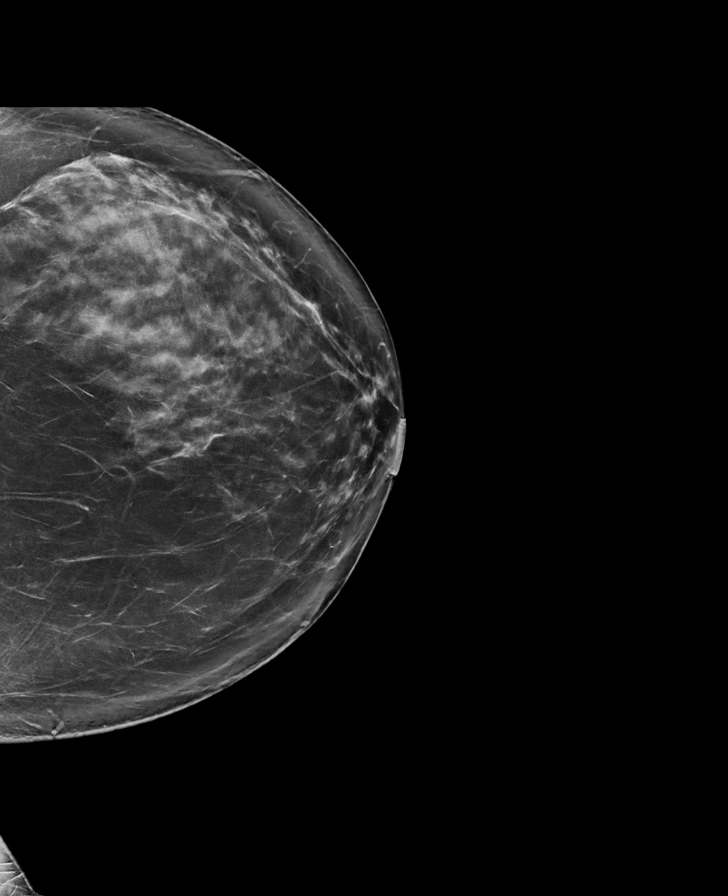

[R CC synth-2D]
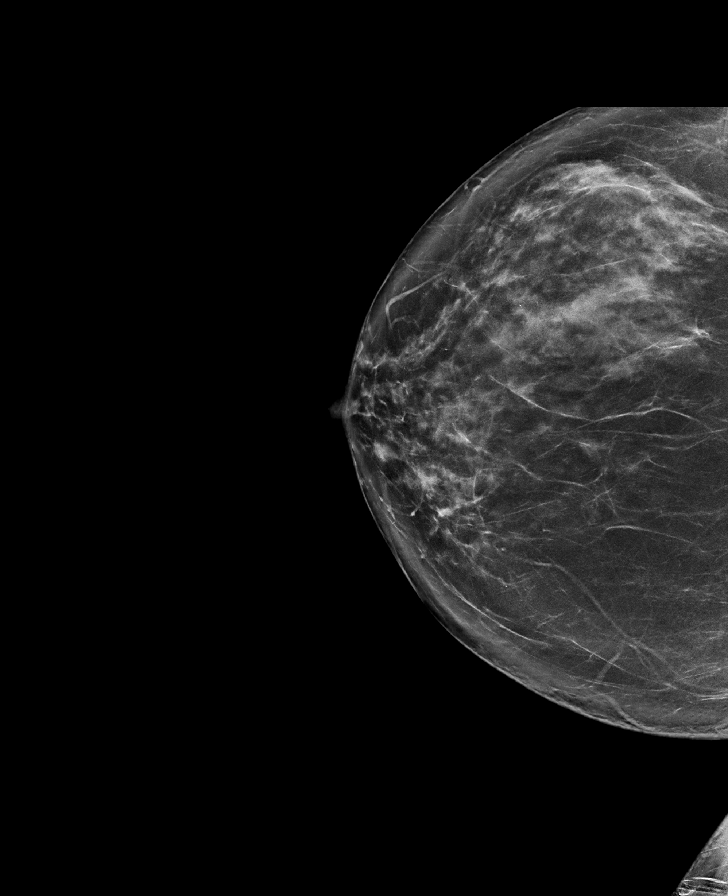

[L MLO synth-2D]
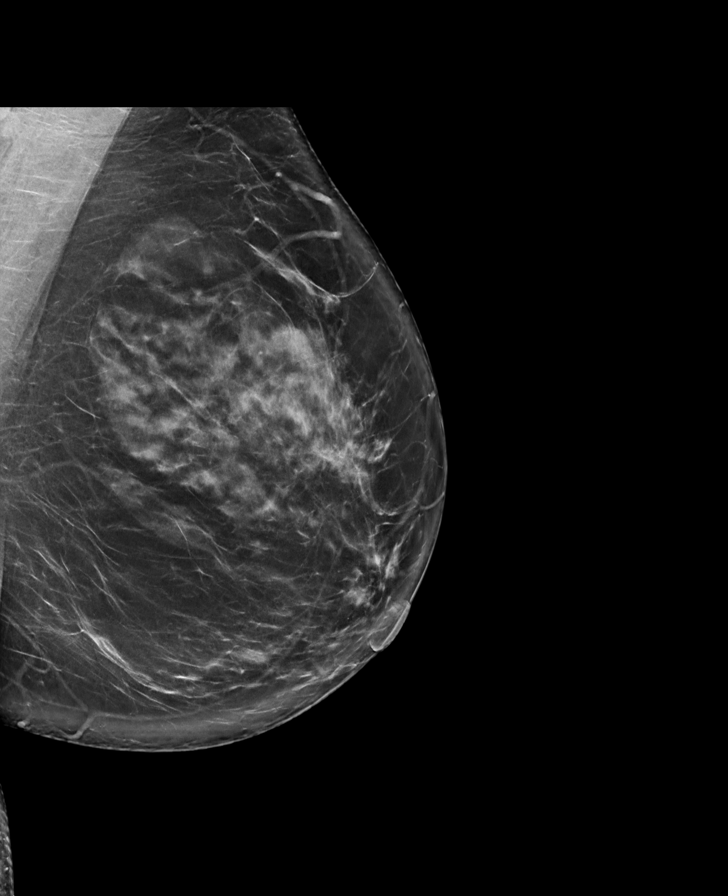

[L CC tomo · tomo slice 47/92.0]
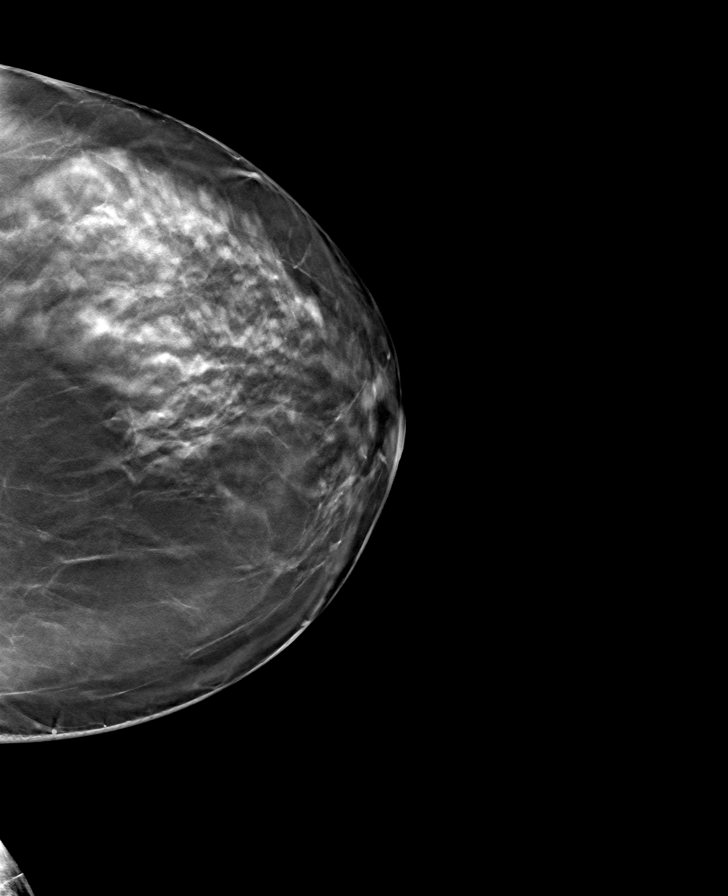

[L MLO tomo · tomo slice 47/94.0]
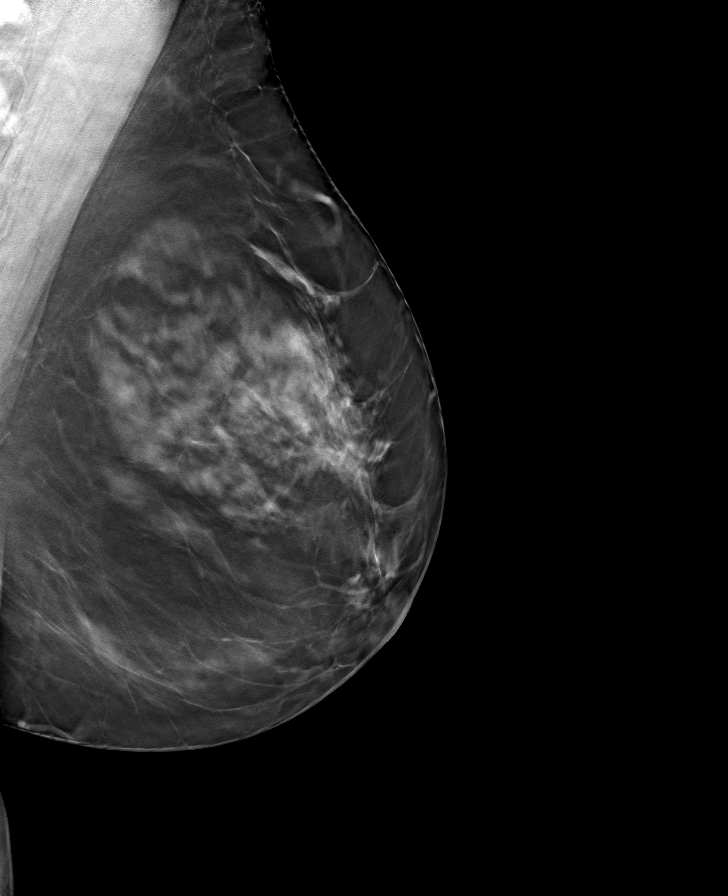

[R CC tomo · tomo slice 45/88.0]
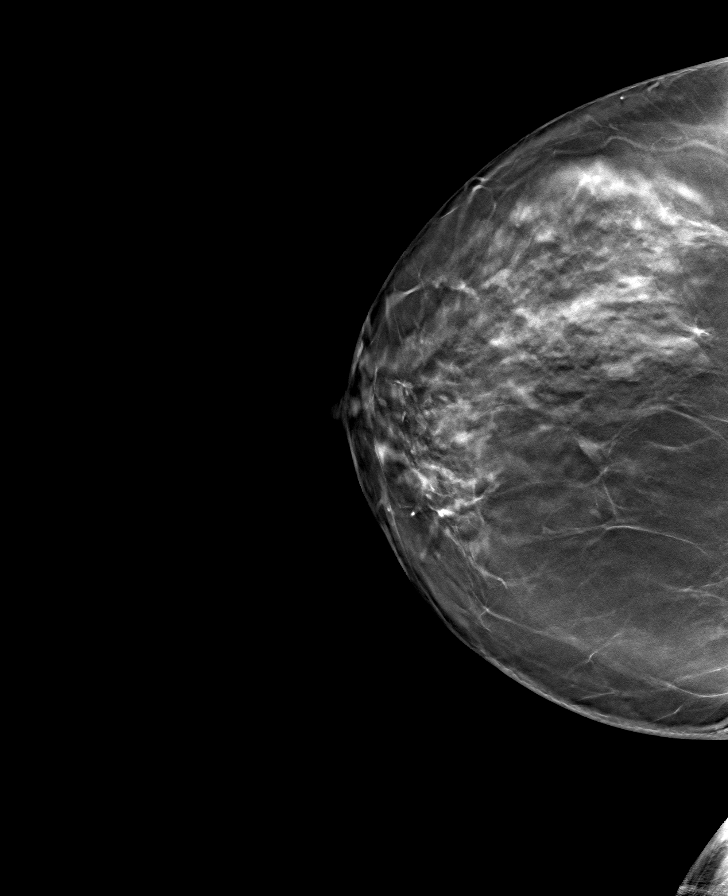

[R MLO tomo · tomo slice 45/89.0]
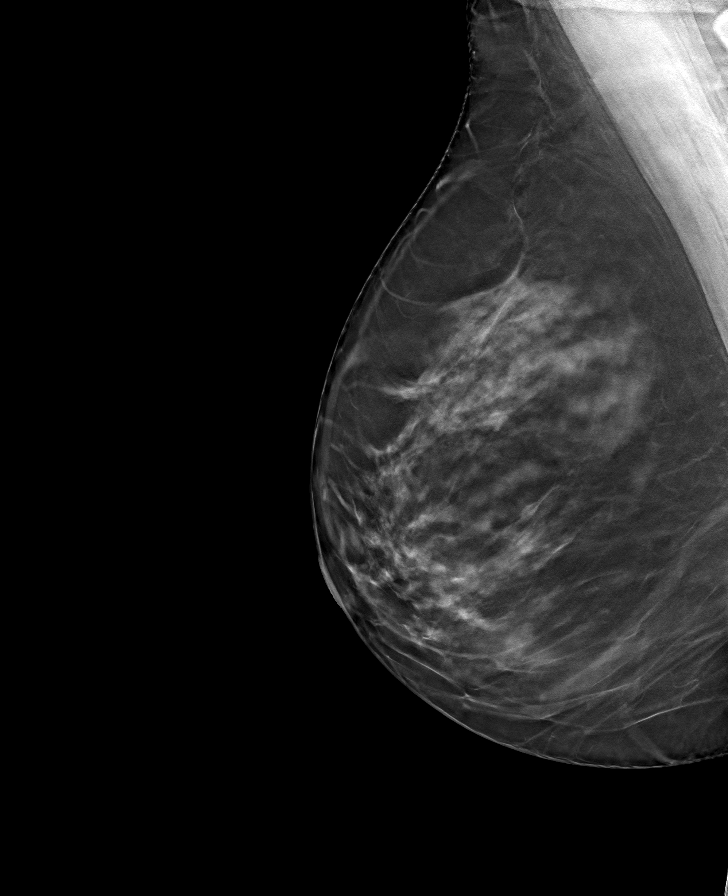

[8 of 24 positions shown; findings below may reference images not displayed]

ACR Breast Density Category c: The breast tissue is heterogeneously
dense, which may obscure small masses
FINDINGS: There are no findings suspicious for malignancy. Images were
processed with CAD.
IMPRESSION: No mammographic evidence of malignancy. A result letter of this
screening mammogram will be mailed directly to the patient.

RECOMMENDATION:
Screening mammogram in one year. (Code:EM-2-IHY)

BI-RADS CATEGORY  1: Negative.

## 2023-05-29 IMAGING — CR DG CHEST 2V
2 series · 2 of 2 positions shown · non-contrast
Comparison: None.

CLINICAL DATA: Left-sided chest pain

EXAM:
CHEST - 2 VIEW

[chest pa]
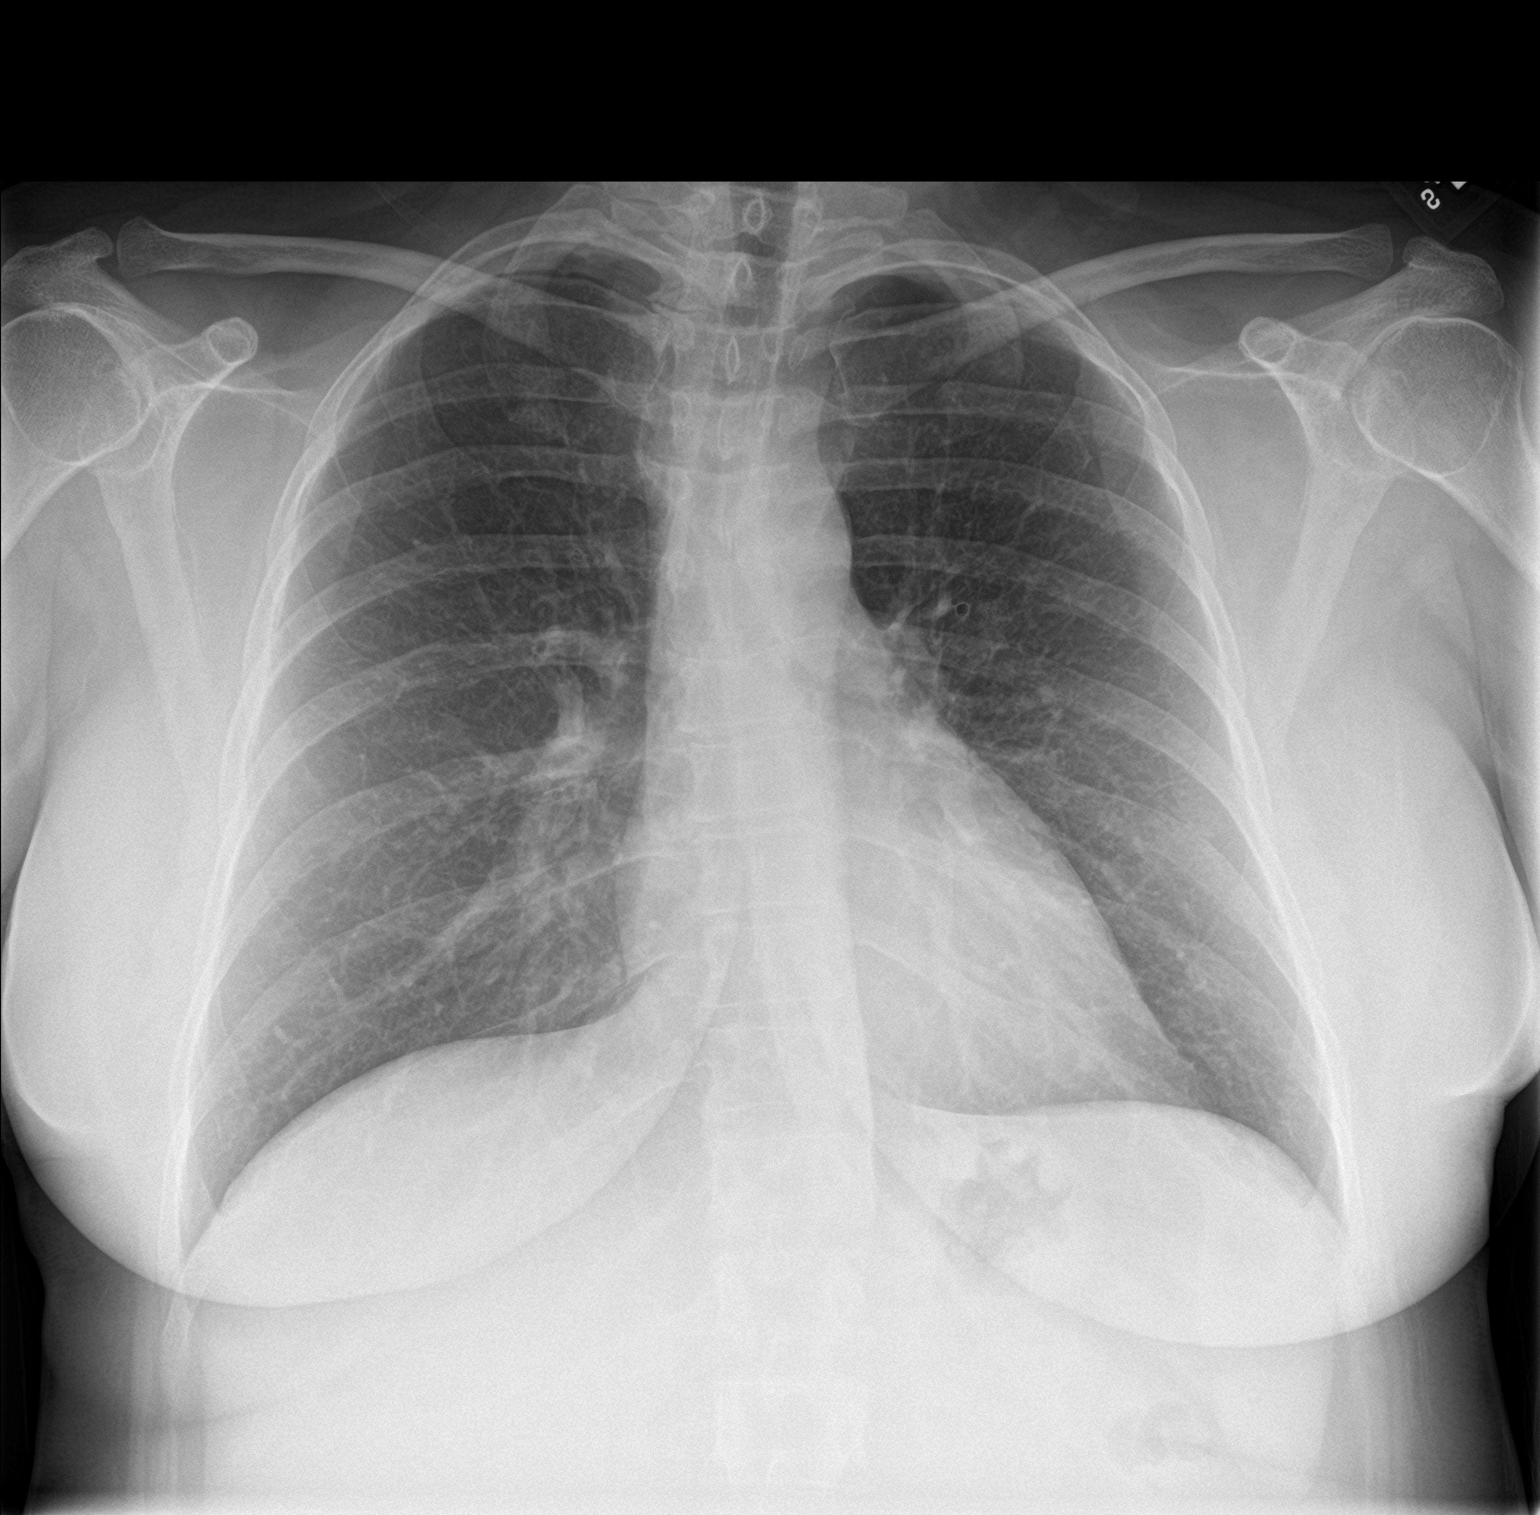

[chest lat]
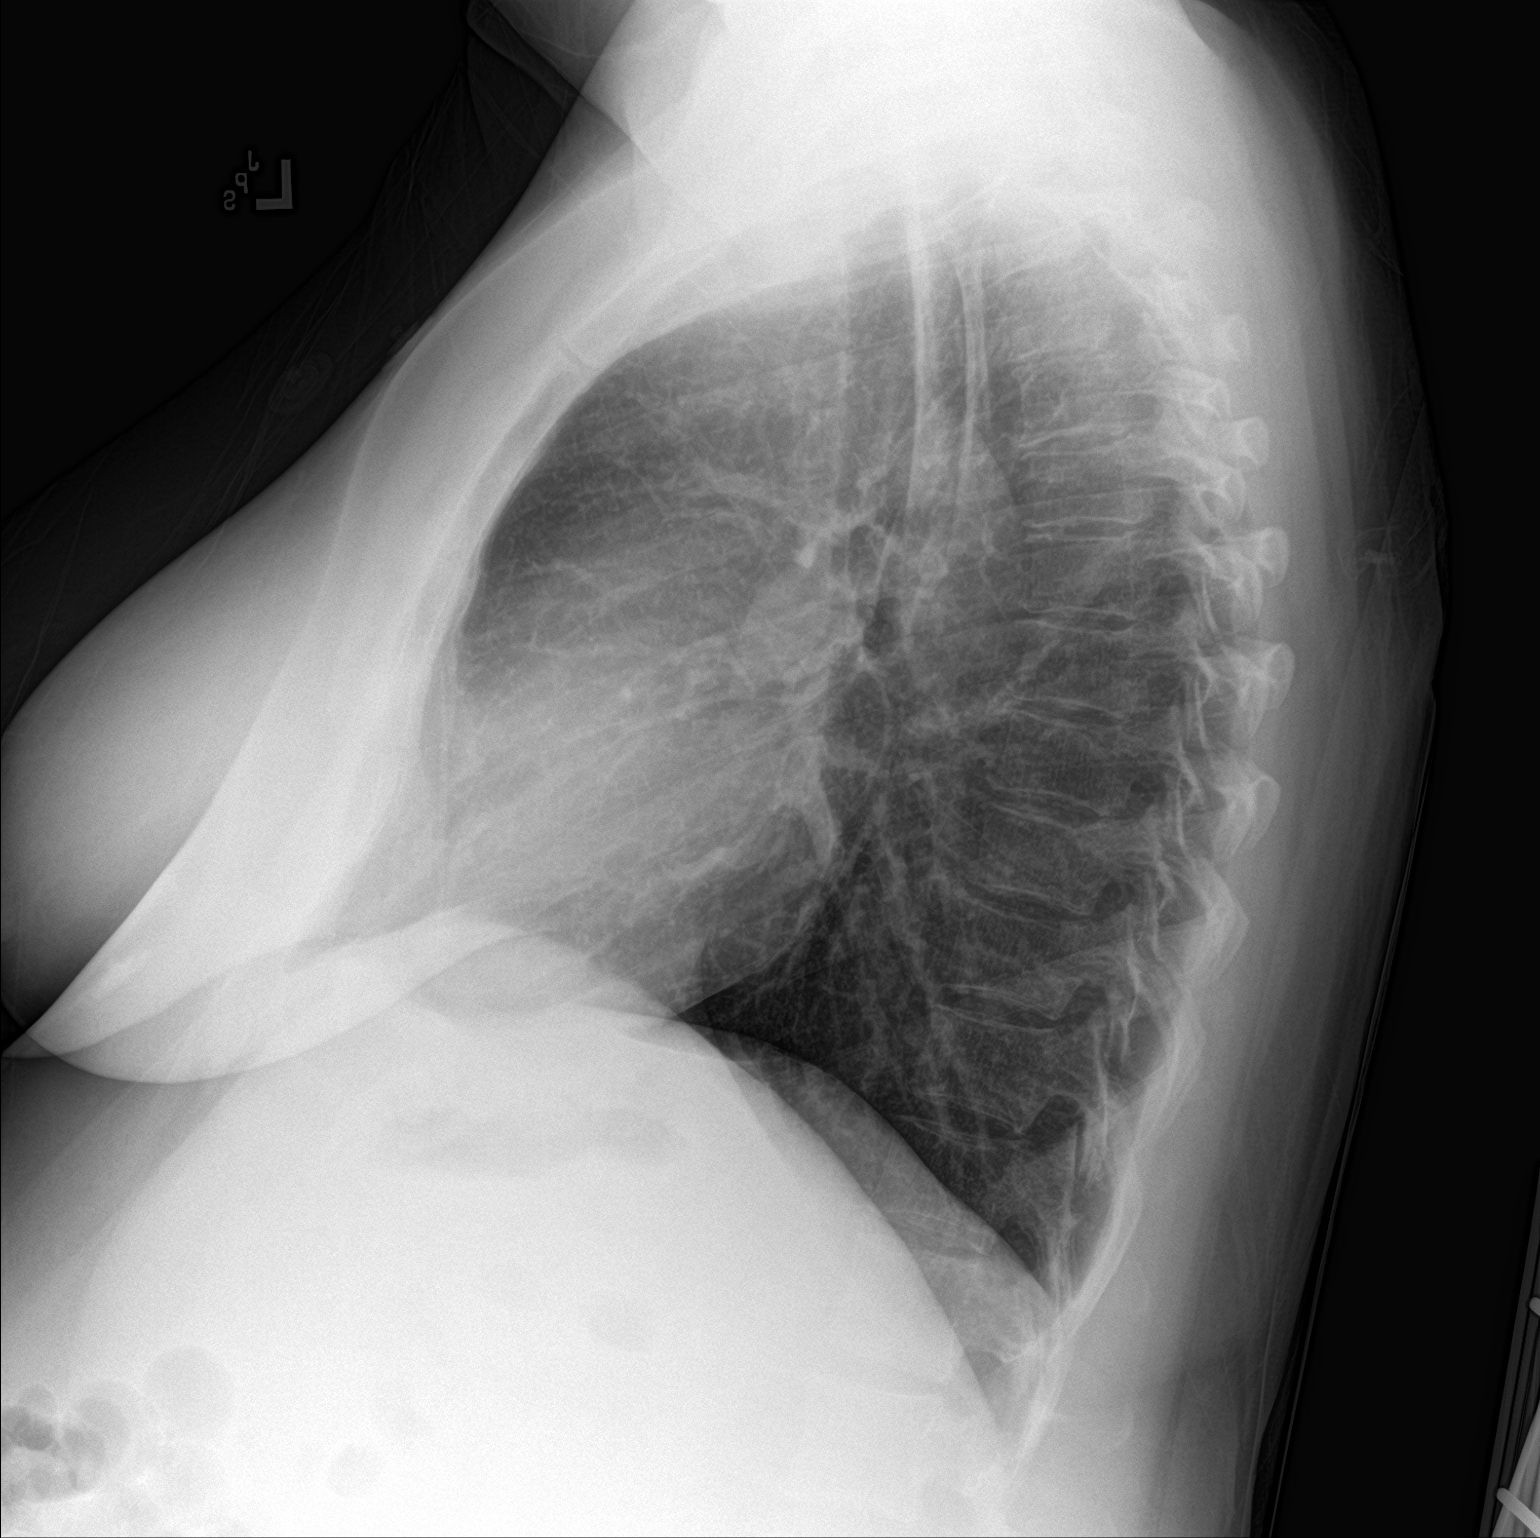

[2 of 2 positions shown; findings below may reference images not displayed]

FINDINGS: The heart size and mediastinal contours are within normal limits.
Both lungs are clear. The visualized skeletal structures are
unremarkable.
IMPRESSION: No active cardiopulmonary disease.
